# Patient Record
Sex: Male | Born: 1935
Health system: Southern US, Community
[De-identification: ages and names within clinical notes are randomized; demographics above are authoritative.]

---

## 2004-02-04 ENCOUNTER — Encounter: Admission: RE | Admit: 2004-02-04 | Discharge: 2004-02-28 | Payer: Self-pay | Admitting: Family Medicine

## 2005-07-08 ENCOUNTER — Encounter: Admission: RE | Admit: 2005-07-08 | Discharge: 2005-07-08 | Payer: Self-pay | Admitting: Family Medicine

## 2006-11-12 DIAGNOSIS — R55 Syncope and collapse: Secondary | ICD-10-CM

## 2006-11-12 DIAGNOSIS — I472 Ventricular tachycardia: Secondary | ICD-10-CM

## 2010-07-03 ENCOUNTER — Other Ambulatory Visit: Payer: Self-pay | Admitting: Gastroenterology

## 2010-07-04 ENCOUNTER — Ambulatory Visit
Admission: RE | Admit: 2010-07-04 | Discharge: 2010-07-04 | Disposition: A | Discharge status: Home / Self Care | Payer: Medicare HMO | Source: Ambulatory Visit | Attending: Gastroenterology | Admitting: Gastroenterology

## 2011-08-25 DIAGNOSIS — Z95810 Presence of automatic (implantable) cardiac defibrillator: Secondary | ICD-10-CM | POA: Insufficient documentation

## 2012-05-12 ENCOUNTER — Other Ambulatory Visit: Payer: Self-pay | Admitting: Family Medicine

## 2012-05-12 DIAGNOSIS — I679 Cerebrovascular disease, unspecified: Secondary | ICD-10-CM

## 2012-05-16 ENCOUNTER — Ambulatory Visit
Admission: RE | Admit: 2012-05-16 | Discharge: 2012-05-16 | Disposition: A | Discharge status: Home / Self Care | Payer: Medicare HMO | Source: Ambulatory Visit | Attending: Family Medicine | Admitting: Family Medicine

## 2012-05-16 DIAGNOSIS — I679 Cerebrovascular disease, unspecified: Secondary | ICD-10-CM

## 2014-01-12 DIAGNOSIS — R05 Cough: Secondary | ICD-10-CM | POA: Insufficient documentation

## 2014-01-12 DIAGNOSIS — R630 Anorexia: Secondary | ICD-10-CM | POA: Insufficient documentation

## 2014-01-12 DIAGNOSIS — R112 Nausea with vomiting, unspecified: Secondary | ICD-10-CM | POA: Insufficient documentation

## 2014-01-12 DIAGNOSIS — R058 Other specified cough: Secondary | ICD-10-CM | POA: Insufficient documentation

## 2014-01-13 DIAGNOSIS — E871 Hypo-osmolality and hyponatremia: Secondary | ICD-10-CM | POA: Insufficient documentation

## 2014-01-13 DIAGNOSIS — I272 Pulmonary hypertension, unspecified: Secondary | ICD-10-CM | POA: Insufficient documentation

## 2014-01-13 DIAGNOSIS — R942 Abnormal results of pulmonary function studies: Secondary | ICD-10-CM | POA: Insufficient documentation

## 2014-01-13 DIAGNOSIS — J984 Other disorders of lung: Secondary | ICD-10-CM | POA: Insufficient documentation

## 2014-01-13 DIAGNOSIS — I5022 Chronic systolic (congestive) heart failure: Secondary | ICD-10-CM | POA: Insufficient documentation

## 2014-03-24 DIAGNOSIS — R0602 Shortness of breath: Secondary | ICD-10-CM | POA: Diagnosis not present

## 2014-03-24 DIAGNOSIS — R110 Nausea: Secondary | ICD-10-CM | POA: Diagnosis not present

## 2014-03-24 DIAGNOSIS — J9811 Atelectasis: Secondary | ICD-10-CM | POA: Diagnosis not present

## 2014-03-24 DIAGNOSIS — R079 Chest pain, unspecified: Secondary | ICD-10-CM | POA: Diagnosis not present

## 2014-03-24 DIAGNOSIS — Z95810 Presence of automatic (implantable) cardiac defibrillator: Secondary | ICD-10-CM | POA: Diagnosis not present

## 2014-03-24 DIAGNOSIS — Z4502 Encounter for adjustment and management of automatic implantable cardiac defibrillator: Secondary | ICD-10-CM | POA: Diagnosis not present

## 2014-03-24 DIAGNOSIS — I517 Cardiomegaly: Secondary | ICD-10-CM | POA: Diagnosis not present

## 2014-03-29 DIAGNOSIS — R911 Solitary pulmonary nodule: Secondary | ICD-10-CM | POA: Diagnosis not present

## 2014-03-29 DIAGNOSIS — J209 Acute bronchitis, unspecified: Secondary | ICD-10-CM | POA: Diagnosis not present

## 2014-03-29 DIAGNOSIS — I10 Essential (primary) hypertension: Secondary | ICD-10-CM | POA: Diagnosis not present

## 2014-03-29 DIAGNOSIS — N390 Urinary tract infection, site not specified: Secondary | ICD-10-CM | POA: Diagnosis not present

## 2014-03-29 DIAGNOSIS — I517 Cardiomegaly: Secondary | ICD-10-CM | POA: Diagnosis not present

## 2014-03-29 DIAGNOSIS — I509 Heart failure, unspecified: Secondary | ICD-10-CM | POA: Diagnosis not present

## 2014-03-29 DIAGNOSIS — E041 Nontoxic single thyroid nodule: Secondary | ICD-10-CM | POA: Diagnosis not present

## 2014-03-29 DIAGNOSIS — R079 Chest pain, unspecified: Secondary | ICD-10-CM | POA: Diagnosis not present

## 2014-03-29 DIAGNOSIS — I712 Thoracic aortic aneurysm, without rupture: Secondary | ICD-10-CM | POA: Diagnosis not present

## 2014-03-29 DIAGNOSIS — I711 Thoracic aortic aneurysm, ruptured: Secondary | ICD-10-CM | POA: Diagnosis not present

## 2014-03-29 DIAGNOSIS — I252 Old myocardial infarction: Secondary | ICD-10-CM | POA: Diagnosis not present

## 2014-03-30 DIAGNOSIS — I4892 Unspecified atrial flutter: Secondary | ICD-10-CM | POA: Diagnosis not present

## 2014-03-30 DIAGNOSIS — I472 Ventricular tachycardia: Secondary | ICD-10-CM | POA: Diagnosis not present

## 2014-03-30 DIAGNOSIS — I484 Atypical atrial flutter: Secondary | ICD-10-CM | POA: Insufficient documentation

## 2014-03-30 DIAGNOSIS — I2510 Atherosclerotic heart disease of native coronary artery without angina pectoris: Secondary | ICD-10-CM | POA: Diagnosis not present

## 2014-03-30 DIAGNOSIS — I2583 Coronary atherosclerosis due to lipid rich plaque: Secondary | ICD-10-CM | POA: Diagnosis not present

## 2014-04-05 DIAGNOSIS — I255 Ischemic cardiomyopathy: Secondary | ICD-10-CM | POA: Diagnosis not present

## 2014-04-05 DIAGNOSIS — I481 Persistent atrial fibrillation: Secondary | ICD-10-CM | POA: Diagnosis not present

## 2014-04-05 DIAGNOSIS — I5022 Chronic systolic (congestive) heart failure: Secondary | ICD-10-CM | POA: Diagnosis not present

## 2014-04-09 DIAGNOSIS — I5022 Chronic systolic (congestive) heart failure: Secondary | ICD-10-CM | POA: Diagnosis not present

## 2014-04-09 DIAGNOSIS — I255 Ischemic cardiomyopathy: Secondary | ICD-10-CM | POA: Diagnosis not present

## 2014-04-09 DIAGNOSIS — E785 Hyperlipidemia, unspecified: Secondary | ICD-10-CM | POA: Diagnosis not present

## 2014-04-09 DIAGNOSIS — K219 Gastro-esophageal reflux disease without esophagitis: Secondary | ICD-10-CM | POA: Diagnosis not present

## 2014-04-09 DIAGNOSIS — I481 Persistent atrial fibrillation: Secondary | ICD-10-CM | POA: Diagnosis not present

## 2014-04-09 DIAGNOSIS — I2510 Atherosclerotic heart disease of native coronary artery without angina pectoris: Secondary | ICD-10-CM | POA: Diagnosis not present

## 2014-04-09 DIAGNOSIS — I480 Paroxysmal atrial fibrillation: Secondary | ICD-10-CM | POA: Diagnosis not present

## 2014-04-09 DIAGNOSIS — H409 Unspecified glaucoma: Secondary | ICD-10-CM | POA: Diagnosis not present

## 2014-04-10 DIAGNOSIS — I481 Persistent atrial fibrillation: Secondary | ICD-10-CM | POA: Diagnosis not present

## 2014-04-10 DIAGNOSIS — I4819 Other persistent atrial fibrillation: Secondary | ICD-10-CM | POA: Insufficient documentation

## 2014-04-11 DIAGNOSIS — I481 Persistent atrial fibrillation: Secondary | ICD-10-CM | POA: Diagnosis not present

## 2014-04-27 DIAGNOSIS — I481 Persistent atrial fibrillation: Secondary | ICD-10-CM | POA: Diagnosis not present

## 2014-04-27 DIAGNOSIS — I472 Ventricular tachycardia: Secondary | ICD-10-CM | POA: Diagnosis not present

## 2014-04-27 DIAGNOSIS — I255 Ischemic cardiomyopathy: Secondary | ICD-10-CM | POA: Diagnosis not present

## 2014-05-09 DIAGNOSIS — I5022 Chronic systolic (congestive) heart failure: Secondary | ICD-10-CM | POA: Diagnosis not present

## 2014-05-09 DIAGNOSIS — I255 Ischemic cardiomyopathy: Secondary | ICD-10-CM | POA: Diagnosis not present

## 2014-05-09 DIAGNOSIS — Z95810 Presence of automatic (implantable) cardiac defibrillator: Secondary | ICD-10-CM | POA: Diagnosis not present

## 2014-05-09 DIAGNOSIS — I472 Ventricular tachycardia: Secondary | ICD-10-CM | POA: Diagnosis not present

## 2014-05-10 IMAGING — US US CAROTID DUPLEX BILAT
1 series · 13 of 24 positions shown · non-contrast
Comparison: None available

CLINICAL DATA: Hypertension, coronary artery disease, old stroke.

BILATERAL CAROTID DUPLEX ULTRASOUND
TECHNIQUE: Gray scale imaging, color Doppler and duplex ultrasound
was performed of bilateral carotid and vertebral arteries in the
neck.

[Series 1: us carotid duplex bilat · 0.08mm/px · 13 of 62 slices shown]
[im 1/62]
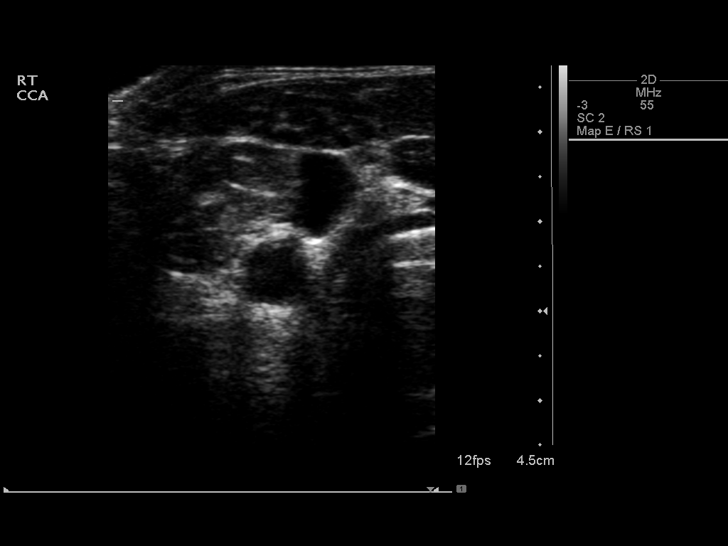
[im 6/62]
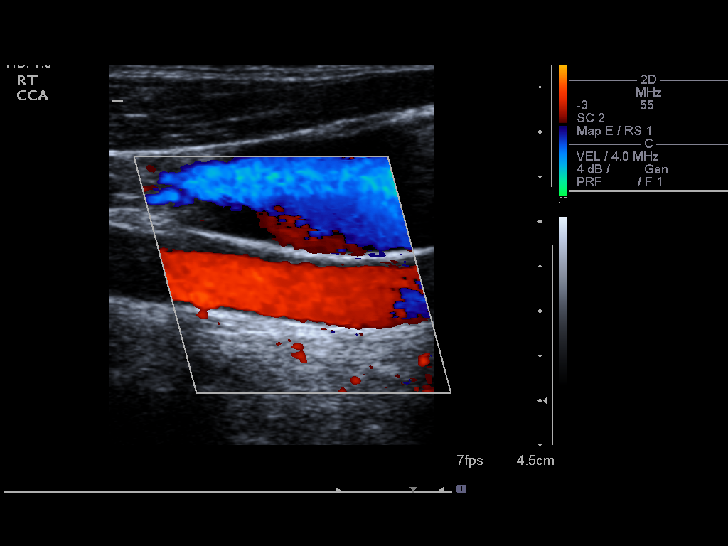
[im 11/62]
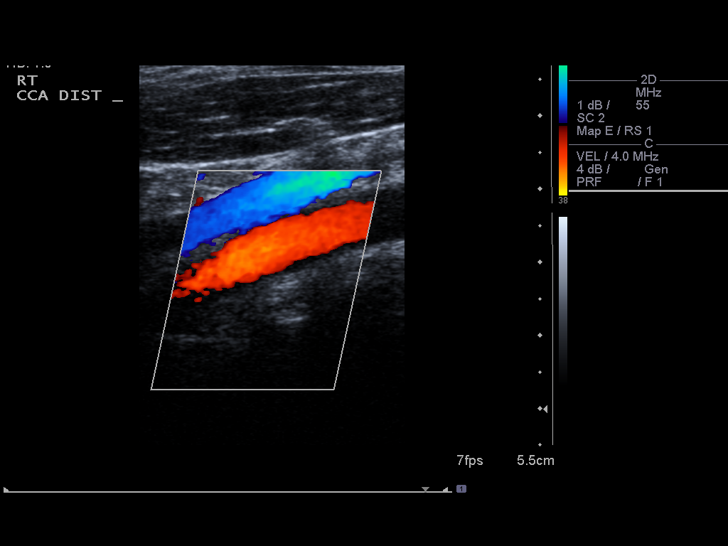
[im 16/62]
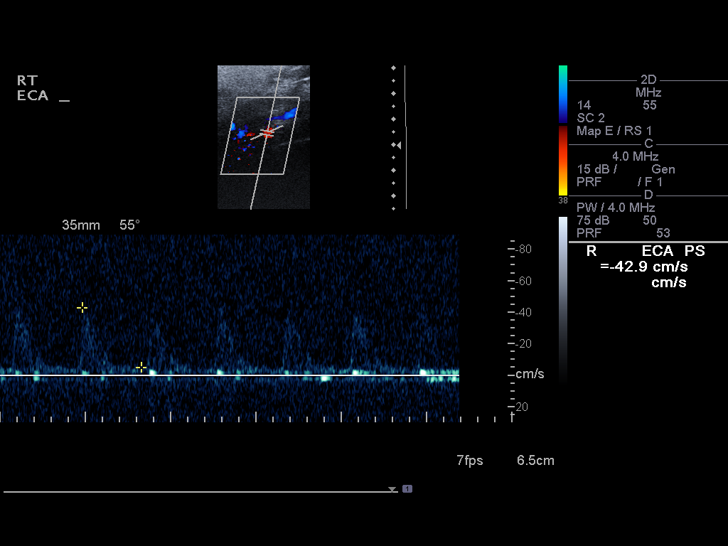
[im 22/62]
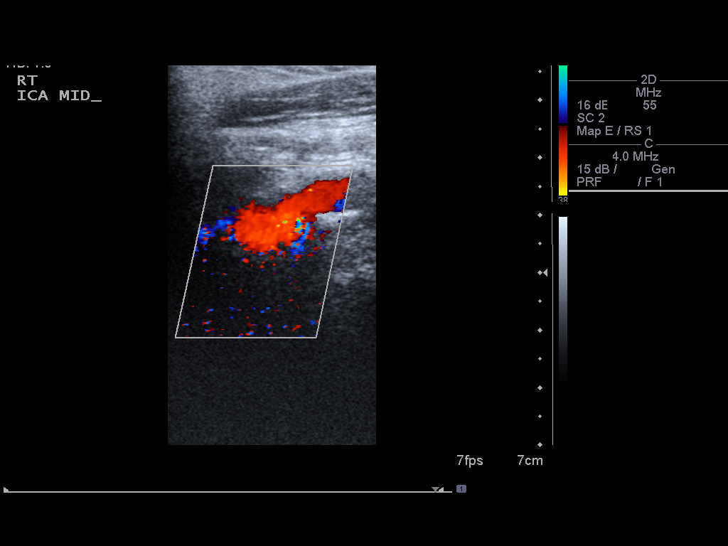
[im 27/62]
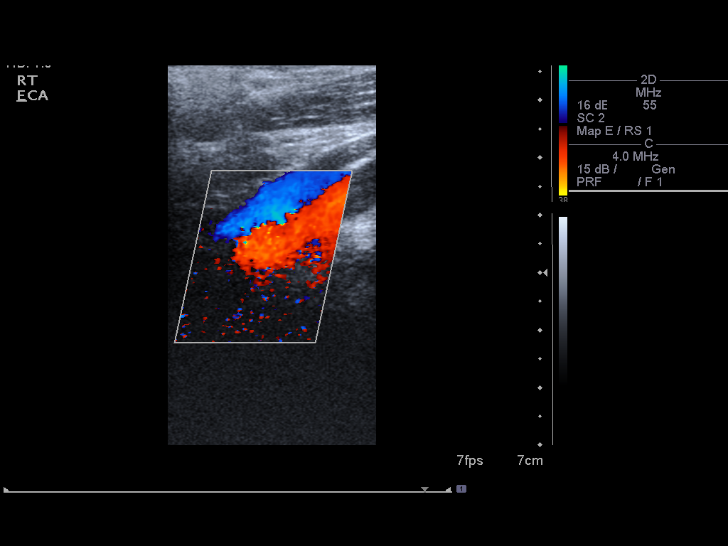
[im 32/62]
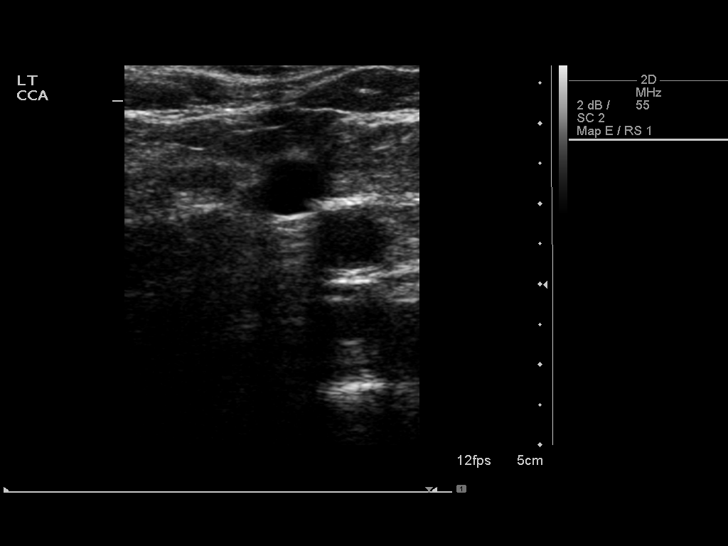
[im 35/62]
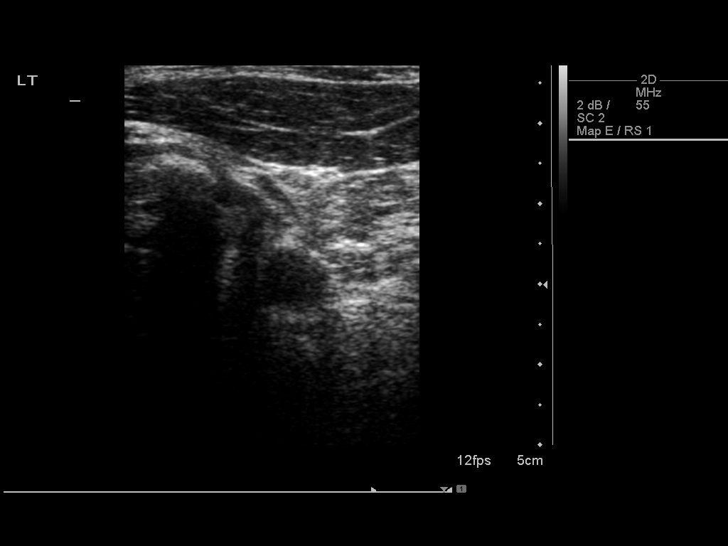
[im 40/62]
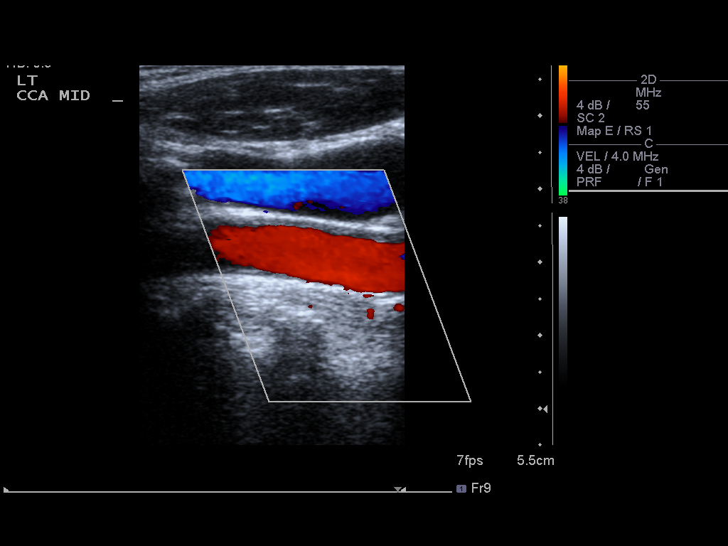
[im 46/62]
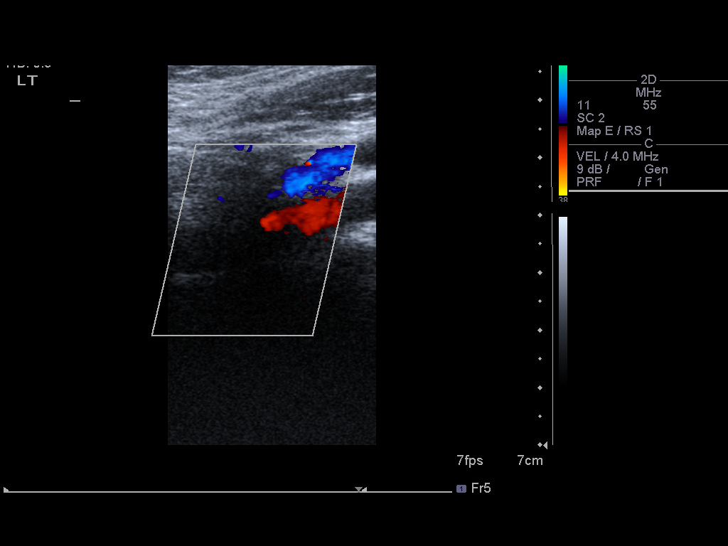
[im 51/62]
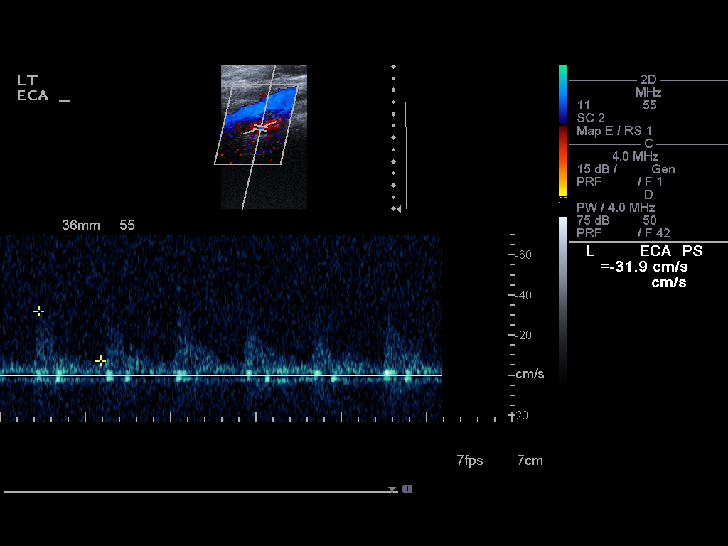
[im 56/62]
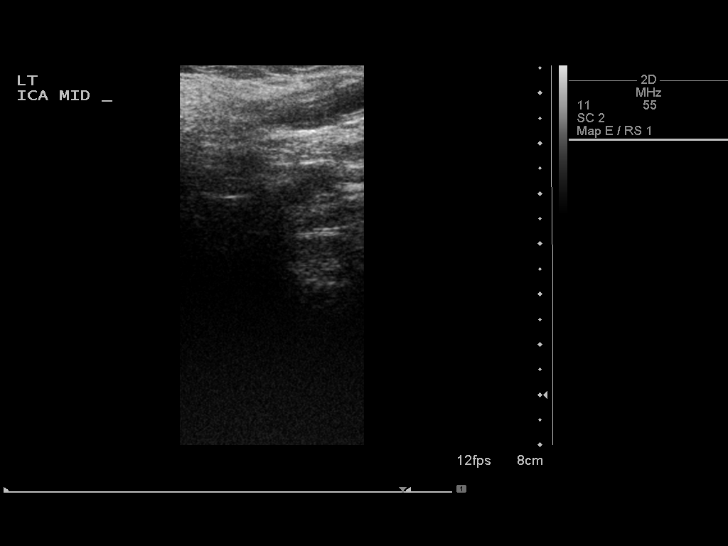
[im 62/62]
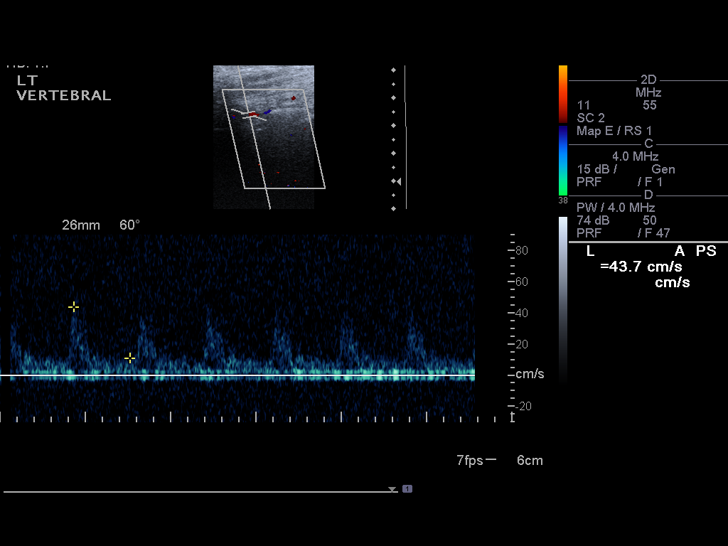

[13 of 24 positions shown; findings below may reference images not displayed]

Criteria:  Quantification of carotid stenosis is based on velocity
parameters that correlate the residual internal carotid diameter
with NASCET-based stenosis levels, using the diameter of the distal
internal carotid lumen as the denominator for stenosis measurement.

The following velocity measurements were obtained:

                 PEAK SYSTOLIC/END DIASTOLIC
RIGHT
ICA:                        60/18cm/sec
CCA:                        80/13cm/sec
SYSTOLIC ICA/CCA RATIO:
DIASTOLIC ICA/CCA RATIO:
ECA:                        49cm/sec

LEFT
ICA:                        49/15cm/sec
CCA:                        72/15cm/sec
SYSTOLIC ICA/CCA RATIO:
DIASTOLIC ICA/CCA RATIO:
ECA:                        32cm/sec
FINDINGS: RIGHT CAROTID ARTERY: There is diffuse intimal thickening through
the common carotid artery and bulb without focal plaque
accumulation or stenosis.  Normal wave forms and color Doppler
signal.

RIGHT VERTEBRAL ARTERY:  Normal flow direction and waveform.

LEFT CAROTID ARTERY: There is intimal thickening through the common
carotid artery.  No focal plaque accumulation or stenosis.  Normal
wave forms and color Doppler signal.

LEFT VERTEBRAL ARTERY:  Normal flow direction and waveform.
IMPRESSION: 1.  No significant carotid bifurcation plaque or stenosis.

## 2014-06-05 DIAGNOSIS — R079 Chest pain, unspecified: Secondary | ICD-10-CM | POA: Diagnosis not present

## 2014-06-05 DIAGNOSIS — R6884 Jaw pain: Secondary | ICD-10-CM | POA: Diagnosis not present

## 2014-06-05 DIAGNOSIS — I5022 Chronic systolic (congestive) heart failure: Secondary | ICD-10-CM | POA: Diagnosis not present

## 2014-06-05 DIAGNOSIS — I428 Other cardiomyopathies: Secondary | ICD-10-CM | POA: Diagnosis not present

## 2014-06-05 DIAGNOSIS — I272 Other secondary pulmonary hypertension: Secondary | ICD-10-CM | POA: Diagnosis not present

## 2014-06-05 DIAGNOSIS — I429 Cardiomyopathy, unspecified: Secondary | ICD-10-CM | POA: Diagnosis not present

## 2014-06-05 DIAGNOSIS — I4891 Unspecified atrial fibrillation: Secondary | ICD-10-CM | POA: Diagnosis not present

## 2014-06-05 DIAGNOSIS — R0602 Shortness of breath: Secondary | ICD-10-CM | POA: Diagnosis not present

## 2014-06-05 DIAGNOSIS — R0789 Other chest pain: Secondary | ICD-10-CM | POA: Diagnosis not present

## 2014-06-05 DIAGNOSIS — I270 Primary pulmonary hypertension: Secondary | ICD-10-CM | POA: Diagnosis not present

## 2014-06-05 DIAGNOSIS — E871 Hypo-osmolality and hyponatremia: Secondary | ICD-10-CM | POA: Diagnosis not present

## 2014-06-05 DIAGNOSIS — I340 Nonrheumatic mitral (valve) insufficiency: Secondary | ICD-10-CM | POA: Diagnosis not present

## 2014-06-05 DIAGNOSIS — M542 Cervicalgia: Secondary | ICD-10-CM | POA: Diagnosis not present

## 2014-06-05 DIAGNOSIS — R200 Anesthesia of skin: Secondary | ICD-10-CM | POA: Diagnosis not present

## 2014-06-05 DIAGNOSIS — I509 Heart failure, unspecified: Secondary | ICD-10-CM | POA: Diagnosis not present

## 2014-06-06 DIAGNOSIS — E871 Hypo-osmolality and hyponatremia: Secondary | ICD-10-CM | POA: Diagnosis not present

## 2014-06-06 DIAGNOSIS — I5022 Chronic systolic (congestive) heart failure: Secondary | ICD-10-CM | POA: Diagnosis not present

## 2014-06-06 DIAGNOSIS — R079 Chest pain, unspecified: Secondary | ICD-10-CM | POA: Diagnosis not present

## 2014-06-06 DIAGNOSIS — I509 Heart failure, unspecified: Secondary | ICD-10-CM | POA: Diagnosis not present

## 2014-06-06 DIAGNOSIS — I270 Primary pulmonary hypertension: Secondary | ICD-10-CM | POA: Diagnosis not present

## 2014-06-06 DIAGNOSIS — I429 Cardiomyopathy, unspecified: Secondary | ICD-10-CM | POA: Diagnosis not present

## 2014-06-06 DIAGNOSIS — I4891 Unspecified atrial fibrillation: Secondary | ICD-10-CM | POA: Diagnosis not present

## 2014-06-06 DIAGNOSIS — I272 Other secondary pulmonary hypertension: Secondary | ICD-10-CM | POA: Diagnosis not present

## 2014-06-06 DIAGNOSIS — R0602 Shortness of breath: Secondary | ICD-10-CM | POA: Diagnosis not present

## 2014-06-06 DIAGNOSIS — R6884 Jaw pain: Secondary | ICD-10-CM | POA: Diagnosis not present

## 2014-06-06 DIAGNOSIS — I428 Other cardiomyopathies: Secondary | ICD-10-CM | POA: Diagnosis not present

## 2014-06-06 DIAGNOSIS — I340 Nonrheumatic mitral (valve) insufficiency: Secondary | ICD-10-CM | POA: Diagnosis not present

## 2014-06-13 DIAGNOSIS — I509 Heart failure, unspecified: Secondary | ICD-10-CM | POA: Diagnosis not present

## 2014-06-26 DIAGNOSIS — R208 Other disturbances of skin sensation: Secondary | ICD-10-CM | POA: Diagnosis not present

## 2014-06-26 DIAGNOSIS — M79604 Pain in right leg: Secondary | ICD-10-CM | POA: Diagnosis not present

## 2014-06-26 DIAGNOSIS — M79642 Pain in left hand: Secondary | ICD-10-CM | POA: Diagnosis not present

## 2014-06-26 DIAGNOSIS — M79605 Pain in left leg: Secondary | ICD-10-CM | POA: Diagnosis not present

## 2014-06-27 DIAGNOSIS — I2510 Atherosclerotic heart disease of native coronary artery without angina pectoris: Secondary | ICD-10-CM | POA: Diagnosis not present

## 2014-06-27 DIAGNOSIS — R197 Diarrhea, unspecified: Secondary | ICD-10-CM | POA: Diagnosis not present

## 2014-06-27 DIAGNOSIS — R079 Chest pain, unspecified: Secondary | ICD-10-CM | POA: Diagnosis not present

## 2014-06-27 DIAGNOSIS — I509 Heart failure, unspecified: Secondary | ICD-10-CM | POA: Diagnosis not present

## 2014-06-27 DIAGNOSIS — R109 Unspecified abdominal pain: Secondary | ICD-10-CM | POA: Diagnosis not present

## 2014-06-27 DIAGNOSIS — E780 Pure hypercholesterolemia: Secondary | ICD-10-CM | POA: Diagnosis not present

## 2014-06-27 DIAGNOSIS — R112 Nausea with vomiting, unspecified: Secondary | ICD-10-CM | POA: Diagnosis not present

## 2014-06-27 DIAGNOSIS — I252 Old myocardial infarction: Secondary | ICD-10-CM | POA: Diagnosis not present

## 2014-06-27 DIAGNOSIS — I5022 Chronic systolic (congestive) heart failure: Secondary | ICD-10-CM | POA: Diagnosis not present

## 2014-06-27 DIAGNOSIS — I270 Primary pulmonary hypertension: Secondary | ICD-10-CM | POA: Diagnosis not present

## 2014-06-27 DIAGNOSIS — I4891 Unspecified atrial fibrillation: Secondary | ICD-10-CM | POA: Diagnosis not present

## 2014-06-27 DIAGNOSIS — R0602 Shortness of breath: Secondary | ICD-10-CM | POA: Diagnosis not present

## 2014-06-27 DIAGNOSIS — I10 Essential (primary) hypertension: Secondary | ICD-10-CM | POA: Diagnosis not present

## 2014-06-27 DIAGNOSIS — R0789 Other chest pain: Secondary | ICD-10-CM | POA: Diagnosis not present

## 2014-06-27 DIAGNOSIS — K219 Gastro-esophageal reflux disease without esophagitis: Secondary | ICD-10-CM | POA: Diagnosis not present

## 2014-06-28 DIAGNOSIS — E780 Pure hypercholesterolemia: Secondary | ICD-10-CM | POA: Diagnosis not present

## 2014-06-28 DIAGNOSIS — R0789 Other chest pain: Secondary | ICD-10-CM | POA: Diagnosis not present

## 2014-06-28 DIAGNOSIS — R079 Chest pain, unspecified: Secondary | ICD-10-CM | POA: Diagnosis not present

## 2014-06-28 DIAGNOSIS — Z950 Presence of cardiac pacemaker: Secondary | ICD-10-CM | POA: Diagnosis not present

## 2014-06-28 DIAGNOSIS — I4891 Unspecified atrial fibrillation: Secondary | ICD-10-CM | POA: Diagnosis not present

## 2014-06-28 DIAGNOSIS — I2510 Atherosclerotic heart disease of native coronary artery without angina pectoris: Secondary | ICD-10-CM | POA: Diagnosis not present

## 2014-06-28 DIAGNOSIS — I429 Cardiomyopathy, unspecified: Secondary | ICD-10-CM | POA: Diagnosis not present

## 2014-06-28 DIAGNOSIS — I270 Primary pulmonary hypertension: Secondary | ICD-10-CM | POA: Diagnosis not present

## 2014-06-28 DIAGNOSIS — K219 Gastro-esophageal reflux disease without esophagitis: Secondary | ICD-10-CM | POA: Diagnosis not present

## 2014-06-28 DIAGNOSIS — R197 Diarrhea, unspecified: Secondary | ICD-10-CM | POA: Diagnosis not present

## 2014-06-28 DIAGNOSIS — I10 Essential (primary) hypertension: Secondary | ICD-10-CM | POA: Diagnosis not present

## 2014-06-28 DIAGNOSIS — I252 Old myocardial infarction: Secondary | ICD-10-CM | POA: Diagnosis not present

## 2014-06-28 DIAGNOSIS — I5022 Chronic systolic (congestive) heart failure: Secondary | ICD-10-CM | POA: Diagnosis not present

## 2014-06-28 DIAGNOSIS — I509 Heart failure, unspecified: Secondary | ICD-10-CM | POA: Diagnosis not present

## 2014-07-06 DIAGNOSIS — I4891 Unspecified atrial fibrillation: Secondary | ICD-10-CM | POA: Diagnosis not present

## 2014-07-06 DIAGNOSIS — I509 Heart failure, unspecified: Secondary | ICD-10-CM | POA: Diagnosis not present

## 2014-07-24 DIAGNOSIS — I5022 Chronic systolic (congestive) heart failure: Secondary | ICD-10-CM | POA: Diagnosis not present

## 2014-07-24 DIAGNOSIS — I472 Ventricular tachycardia: Secondary | ICD-10-CM | POA: Diagnosis not present

## 2014-07-24 DIAGNOSIS — I481 Persistent atrial fibrillation: Secondary | ICD-10-CM | POA: Diagnosis not present

## 2014-07-24 DIAGNOSIS — Z4502 Encounter for adjustment and management of automatic implantable cardiac defibrillator: Secondary | ICD-10-CM | POA: Diagnosis not present

## 2014-07-24 DIAGNOSIS — I255 Ischemic cardiomyopathy: Secondary | ICD-10-CM | POA: Diagnosis not present

## 2014-07-24 DIAGNOSIS — I4892 Unspecified atrial flutter: Secondary | ICD-10-CM | POA: Diagnosis not present

## 2014-08-17 DIAGNOSIS — G5602 Carpal tunnel syndrome, left upper limb: Secondary | ICD-10-CM | POA: Diagnosis not present

## 2014-09-12 DIAGNOSIS — M79642 Pain in left hand: Secondary | ICD-10-CM | POA: Diagnosis not present

## 2014-09-12 DIAGNOSIS — G5602 Carpal tunnel syndrome, left upper limb: Secondary | ICD-10-CM | POA: Diagnosis not present

## 2014-09-21 DIAGNOSIS — G5602 Carpal tunnel syndrome, left upper limb: Secondary | ICD-10-CM | POA: Diagnosis not present

## 2014-09-21 DIAGNOSIS — G5622 Lesion of ulnar nerve, left upper limb: Secondary | ICD-10-CM | POA: Insufficient documentation

## 2014-09-26 DIAGNOSIS — G5622 Lesion of ulnar nerve, left upper limb: Secondary | ICD-10-CM | POA: Diagnosis not present

## 2014-09-26 DIAGNOSIS — G5602 Carpal tunnel syndrome, left upper limb: Secondary | ICD-10-CM | POA: Diagnosis not present

## 2014-11-02 DIAGNOSIS — I255 Ischemic cardiomyopathy: Secondary | ICD-10-CM | POA: Diagnosis not present

## 2014-11-02 DIAGNOSIS — I481 Persistent atrial fibrillation: Secondary | ICD-10-CM | POA: Diagnosis not present

## 2014-11-02 DIAGNOSIS — I472 Ventricular tachycardia: Secondary | ICD-10-CM | POA: Diagnosis not present

## 2014-11-02 DIAGNOSIS — Z95810 Presence of automatic (implantable) cardiac defibrillator: Secondary | ICD-10-CM | POA: Diagnosis not present

## 2014-12-04 DIAGNOSIS — G5602 Carpal tunnel syndrome, left upper limb: Secondary | ICD-10-CM | POA: Diagnosis not present

## 2014-12-10 DIAGNOSIS — M25532 Pain in left wrist: Secondary | ICD-10-CM | POA: Diagnosis not present

## 2014-12-10 DIAGNOSIS — G5602 Carpal tunnel syndrome, left upper limb: Secondary | ICD-10-CM | POA: Diagnosis not present

## 2015-01-08 DIAGNOSIS — G5602 Carpal tunnel syndrome, left upper limb: Secondary | ICD-10-CM | POA: Diagnosis not present

## 2015-01-31 DIAGNOSIS — I255 Ischemic cardiomyopathy: Secondary | ICD-10-CM | POA: Diagnosis not present

## 2015-01-31 DIAGNOSIS — I4892 Unspecified atrial flutter: Secondary | ICD-10-CM | POA: Diagnosis not present

## 2015-01-31 DIAGNOSIS — I481 Persistent atrial fibrillation: Secondary | ICD-10-CM | POA: Diagnosis not present

## 2015-01-31 DIAGNOSIS — I5022 Chronic systolic (congestive) heart failure: Secondary | ICD-10-CM | POA: Diagnosis not present

## 2015-01-31 DIAGNOSIS — I472 Ventricular tachycardia: Secondary | ICD-10-CM | POA: Diagnosis not present

## 2015-01-31 DIAGNOSIS — Z4502 Encounter for adjustment and management of automatic implantable cardiac defibrillator: Secondary | ICD-10-CM | POA: Diagnosis not present

## 2015-02-12 DIAGNOSIS — G5602 Carpal tunnel syndrome, left upper limb: Secondary | ICD-10-CM | POA: Diagnosis not present

## 2015-04-22 DIAGNOSIS — R05 Cough: Secondary | ICD-10-CM | POA: Diagnosis not present

## 2015-04-22 DIAGNOSIS — R635 Abnormal weight gain: Secondary | ICD-10-CM | POA: Diagnosis not present

## 2015-04-22 DIAGNOSIS — I509 Heart failure, unspecified: Secondary | ICD-10-CM | POA: Diagnosis not present

## 2015-04-22 DIAGNOSIS — I340 Nonrheumatic mitral (valve) insufficiency: Secondary | ICD-10-CM | POA: Diagnosis not present

## 2015-04-22 DIAGNOSIS — Z95810 Presence of automatic (implantable) cardiac defibrillator: Secondary | ICD-10-CM | POA: Diagnosis not present

## 2015-04-22 DIAGNOSIS — I4891 Unspecified atrial fibrillation: Secondary | ICD-10-CM | POA: Diagnosis not present

## 2015-04-22 DIAGNOSIS — R0989 Other specified symptoms and signs involving the circulatory and respiratory systems: Secondary | ICD-10-CM | POA: Diagnosis not present

## 2015-04-22 DIAGNOSIS — I270 Primary pulmonary hypertension: Secondary | ICD-10-CM | POA: Diagnosis not present

## 2015-04-26 DIAGNOSIS — R0989 Other specified symptoms and signs involving the circulatory and respiratory systems: Secondary | ICD-10-CM | POA: Diagnosis not present

## 2015-04-26 DIAGNOSIS — I509 Heart failure, unspecified: Secondary | ICD-10-CM | POA: Diagnosis not present

## 2015-04-26 DIAGNOSIS — R05 Cough: Secondary | ICD-10-CM | POA: Diagnosis not present

## 2015-04-26 DIAGNOSIS — I340 Nonrheumatic mitral (valve) insufficiency: Secondary | ICD-10-CM | POA: Diagnosis not present

## 2015-04-26 DIAGNOSIS — I270 Primary pulmonary hypertension: Secondary | ICD-10-CM | POA: Diagnosis not present

## 2015-04-26 DIAGNOSIS — I4891 Unspecified atrial fibrillation: Secondary | ICD-10-CM | POA: Diagnosis not present

## 2015-05-08 DIAGNOSIS — I255 Ischemic cardiomyopathy: Secondary | ICD-10-CM | POA: Diagnosis not present

## 2015-05-08 DIAGNOSIS — Z95810 Presence of automatic (implantable) cardiac defibrillator: Secondary | ICD-10-CM | POA: Diagnosis not present

## 2015-05-08 DIAGNOSIS — I472 Ventricular tachycardia: Secondary | ICD-10-CM | POA: Diagnosis not present

## 2015-05-15 DIAGNOSIS — I10 Essential (primary) hypertension: Secondary | ICD-10-CM | POA: Diagnosis not present

## 2015-05-15 DIAGNOSIS — I509 Heart failure, unspecified: Secondary | ICD-10-CM | POA: Diagnosis not present

## 2015-05-15 DIAGNOSIS — I4891 Unspecified atrial fibrillation: Secondary | ICD-10-CM | POA: Diagnosis not present

## 2015-06-21 DIAGNOSIS — R8290 Unspecified abnormal findings in urine: Secondary | ICD-10-CM | POA: Diagnosis not present

## 2015-06-21 DIAGNOSIS — M545 Low back pain: Secondary | ICD-10-CM | POA: Diagnosis not present

## 2015-08-19 DIAGNOSIS — M25551 Pain in right hip: Secondary | ICD-10-CM | POA: Diagnosis not present

## 2015-08-19 DIAGNOSIS — N390 Urinary tract infection, site not specified: Secondary | ICD-10-CM | POA: Diagnosis not present

## 2015-08-19 DIAGNOSIS — M545 Low back pain: Secondary | ICD-10-CM | POA: Diagnosis not present

## 2015-08-19 DIAGNOSIS — Z7901 Long term (current) use of anticoagulants: Secondary | ICD-10-CM | POA: Diagnosis not present

## 2015-08-21 ENCOUNTER — Other Ambulatory Visit: Payer: Self-pay | Admitting: Family Medicine

## 2015-08-21 DIAGNOSIS — M1611 Unilateral primary osteoarthritis, right hip: Secondary | ICD-10-CM | POA: Diagnosis not present

## 2015-08-21 DIAGNOSIS — M8588 Other specified disorders of bone density and structure, other site: Secondary | ICD-10-CM | POA: Diagnosis not present

## 2015-08-21 DIAGNOSIS — I10 Essential (primary) hypertension: Secondary | ICD-10-CM | POA: Diagnosis not present

## 2015-08-21 DIAGNOSIS — N3090 Cystitis, unspecified without hematuria: Secondary | ICD-10-CM | POA: Diagnosis not present

## 2015-08-21 DIAGNOSIS — R109 Unspecified abdominal pain: Secondary | ICD-10-CM

## 2015-08-21 DIAGNOSIS — I714 Abdominal aortic aneurysm, without rupture: Secondary | ICD-10-CM | POA: Diagnosis not present

## 2015-08-21 DIAGNOSIS — E785 Hyperlipidemia, unspecified: Secondary | ICD-10-CM | POA: Diagnosis not present

## 2015-08-21 DIAGNOSIS — R1032 Left lower quadrant pain: Secondary | ICD-10-CM | POA: Diagnosis not present

## 2015-08-21 DIAGNOSIS — N281 Cyst of kidney, acquired: Secondary | ICD-10-CM | POA: Diagnosis not present

## 2015-08-21 DIAGNOSIS — M47816 Spondylosis without myelopathy or radiculopathy, lumbar region: Secondary | ICD-10-CM | POA: Diagnosis not present

## 2015-08-23 DIAGNOSIS — I714 Abdominal aortic aneurysm, without rupture: Secondary | ICD-10-CM | POA: Diagnosis not present

## 2015-08-23 DIAGNOSIS — R109 Unspecified abdominal pain: Secondary | ICD-10-CM | POA: Diagnosis not present

## 2015-08-23 DIAGNOSIS — M25551 Pain in right hip: Secondary | ICD-10-CM | POA: Diagnosis not present

## 2015-08-26 DIAGNOSIS — M25551 Pain in right hip: Secondary | ICD-10-CM | POA: Diagnosis not present

## 2015-08-26 DIAGNOSIS — M545 Low back pain: Secondary | ICD-10-CM | POA: Diagnosis not present

## 2015-09-13 DIAGNOSIS — I5022 Chronic systolic (congestive) heart failure: Secondary | ICD-10-CM | POA: Diagnosis not present

## 2015-09-13 DIAGNOSIS — I472 Ventricular tachycardia: Secondary | ICD-10-CM | POA: Diagnosis not present

## 2015-09-13 DIAGNOSIS — I4892 Unspecified atrial flutter: Secondary | ICD-10-CM | POA: Diagnosis not present

## 2015-09-13 DIAGNOSIS — I255 Ischemic cardiomyopathy: Secondary | ICD-10-CM | POA: Diagnosis not present

## 2015-09-13 DIAGNOSIS — Z4502 Encounter for adjustment and management of automatic implantable cardiac defibrillator: Secondary | ICD-10-CM | POA: Diagnosis not present

## 2015-09-13 DIAGNOSIS — I481 Persistent atrial fibrillation: Secondary | ICD-10-CM | POA: Diagnosis not present

## 2015-10-16 DIAGNOSIS — R05 Cough: Secondary | ICD-10-CM | POA: Diagnosis not present

## 2015-11-01 DIAGNOSIS — R05 Cough: Secondary | ICD-10-CM | POA: Diagnosis not present

## 2015-12-23 DIAGNOSIS — I481 Persistent atrial fibrillation: Secondary | ICD-10-CM | POA: Diagnosis not present

## 2015-12-23 DIAGNOSIS — Z95810 Presence of automatic (implantable) cardiac defibrillator: Secondary | ICD-10-CM | POA: Diagnosis not present

## 2015-12-23 DIAGNOSIS — I5022 Chronic systolic (congestive) heart failure: Secondary | ICD-10-CM | POA: Diagnosis not present

## 2015-12-23 DIAGNOSIS — I255 Ischemic cardiomyopathy: Secondary | ICD-10-CM | POA: Diagnosis not present

## 2016-02-27 DIAGNOSIS — R05 Cough: Secondary | ICD-10-CM | POA: Diagnosis not present

## 2016-03-02 DIAGNOSIS — I5022 Chronic systolic (congestive) heart failure: Secondary | ICD-10-CM | POA: Diagnosis not present

## 2016-03-02 DIAGNOSIS — I255 Ischemic cardiomyopathy: Secondary | ICD-10-CM | POA: Diagnosis not present

## 2016-03-02 DIAGNOSIS — I480 Paroxysmal atrial fibrillation: Secondary | ICD-10-CM | POA: Diagnosis not present

## 2016-03-02 DIAGNOSIS — I484 Atypical atrial flutter: Secondary | ICD-10-CM | POA: Diagnosis not present

## 2016-03-03 DIAGNOSIS — I4891 Unspecified atrial fibrillation: Secondary | ICD-10-CM | POA: Diagnosis not present

## 2016-04-06 DIAGNOSIS — Z95810 Presence of automatic (implantable) cardiac defibrillator: Secondary | ICD-10-CM | POA: Diagnosis not present

## 2016-04-06 DIAGNOSIS — I255 Ischemic cardiomyopathy: Secondary | ICD-10-CM | POA: Diagnosis not present

## 2016-04-06 DIAGNOSIS — I5022 Chronic systolic (congestive) heart failure: Secondary | ICD-10-CM | POA: Diagnosis not present

## 2016-04-06 DIAGNOSIS — I472 Ventricular tachycardia: Secondary | ICD-10-CM | POA: Diagnosis not present

## 2016-06-24 DIAGNOSIS — I255 Ischemic cardiomyopathy: Secondary | ICD-10-CM | POA: Diagnosis not present

## 2016-08-19 ENCOUNTER — Other Ambulatory Visit: Payer: Self-pay | Admitting: Family Medicine

## 2016-08-19 DIAGNOSIS — I714 Abdominal aortic aneurysm, without rupture, unspecified: Secondary | ICD-10-CM

## 2016-08-27 ENCOUNTER — Ambulatory Visit
Admission: RE | Admit: 2016-08-27 | Discharge: 2016-08-27 | Disposition: A | Discharge status: Home / Self Care | Payer: Medicare HMO | Source: Ambulatory Visit | Attending: Family Medicine | Admitting: Family Medicine

## 2016-08-27 DIAGNOSIS — I714 Abdominal aortic aneurysm, without rupture, unspecified: Secondary | ICD-10-CM

## 2016-10-06 DIAGNOSIS — I484 Atypical atrial flutter: Secondary | ICD-10-CM | POA: Diagnosis not present

## 2016-10-06 DIAGNOSIS — I255 Ischemic cardiomyopathy: Secondary | ICD-10-CM | POA: Diagnosis not present

## 2016-10-06 DIAGNOSIS — I472 Ventricular tachycardia: Secondary | ICD-10-CM | POA: Diagnosis not present

## 2016-10-06 DIAGNOSIS — Z4502 Encounter for adjustment and management of automatic implantable cardiac defibrillator: Secondary | ICD-10-CM | POA: Diagnosis not present

## 2016-10-06 DIAGNOSIS — I2510 Atherosclerotic heart disease of native coronary artery without angina pectoris: Secondary | ICD-10-CM | POA: Diagnosis not present

## 2016-10-06 DIAGNOSIS — I481 Persistent atrial fibrillation: Secondary | ICD-10-CM | POA: Diagnosis not present

## 2016-11-20 ENCOUNTER — Other Ambulatory Visit: Payer: Self-pay | Admitting: Family Medicine

## 2016-11-20 DIAGNOSIS — I739 Peripheral vascular disease, unspecified: Secondary | ICD-10-CM

## 2016-11-20 DIAGNOSIS — E669 Obesity, unspecified: Secondary | ICD-10-CM | POA: Diagnosis not present

## 2016-11-20 DIAGNOSIS — Z23 Encounter for immunization: Secondary | ICD-10-CM | POA: Diagnosis not present

## 2016-11-20 DIAGNOSIS — I10 Essential (primary) hypertension: Secondary | ICD-10-CM | POA: Diagnosis not present

## 2016-11-20 DIAGNOSIS — Z6832 Body mass index (BMI) 32.0-32.9, adult: Secondary | ICD-10-CM | POA: Diagnosis not present

## 2016-11-26 ENCOUNTER — Ambulatory Visit
Admission: RE | Admit: 2016-11-26 | Discharge: 2016-11-26 | Disposition: A | Discharge status: Home / Self Care | Payer: Medicare HMO | Source: Ambulatory Visit | Attending: Family Medicine | Admitting: Family Medicine

## 2016-11-26 DIAGNOSIS — I739 Peripheral vascular disease, unspecified: Secondary | ICD-10-CM

## 2017-01-06 DIAGNOSIS — Z4502 Encounter for adjustment and management of automatic implantable cardiac defibrillator: Secondary | ICD-10-CM | POA: Diagnosis not present

## 2017-01-08 DIAGNOSIS — R0609 Other forms of dyspnea: Secondary | ICD-10-CM | POA: Diagnosis not present

## 2017-01-08 DIAGNOSIS — R5383 Other fatigue: Secondary | ICD-10-CM | POA: Diagnosis not present

## 2017-01-08 DIAGNOSIS — Z6832 Body mass index (BMI) 32.0-32.9, adult: Secondary | ICD-10-CM | POA: Diagnosis not present

## 2017-01-08 DIAGNOSIS — I255 Ischemic cardiomyopathy: Secondary | ICD-10-CM | POA: Diagnosis not present

## 2017-04-08 DIAGNOSIS — R0789 Other chest pain: Secondary | ICD-10-CM | POA: Diagnosis not present

## 2017-04-08 DIAGNOSIS — N3000 Acute cystitis without hematuria: Secondary | ICD-10-CM | POA: Diagnosis not present

## 2017-04-08 DIAGNOSIS — R0602 Shortness of breath: Secondary | ICD-10-CM | POA: Diagnosis not present

## 2017-04-08 DIAGNOSIS — R0989 Other specified symptoms and signs involving the circulatory and respiratory systems: Secondary | ICD-10-CM | POA: Diagnosis not present

## 2017-04-08 DIAGNOSIS — I2510 Atherosclerotic heart disease of native coronary artery without angina pectoris: Secondary | ICD-10-CM | POA: Diagnosis not present

## 2017-04-08 DIAGNOSIS — I4891 Unspecified atrial fibrillation: Secondary | ICD-10-CM | POA: Diagnosis not present

## 2017-04-08 DIAGNOSIS — Z95810 Presence of automatic (implantable) cardiac defibrillator: Secondary | ICD-10-CM | POA: Diagnosis not present

## 2017-04-08 DIAGNOSIS — N183 Chronic kidney disease, stage 3 unspecified: Secondary | ICD-10-CM | POA: Insufficient documentation

## 2017-04-08 DIAGNOSIS — R079 Chest pain, unspecified: Secondary | ICD-10-CM | POA: Diagnosis not present

## 2017-04-08 DIAGNOSIS — J9811 Atelectasis: Secondary | ICD-10-CM | POA: Diagnosis not present

## 2017-04-08 DIAGNOSIS — D72829 Elevated white blood cell count, unspecified: Secondary | ICD-10-CM | POA: Insufficient documentation

## 2017-04-08 DIAGNOSIS — I129 Hypertensive chronic kidney disease with stage 1 through stage 4 chronic kidney disease, or unspecified chronic kidney disease: Secondary | ICD-10-CM | POA: Diagnosis not present

## 2017-04-08 DIAGNOSIS — I255 Ischemic cardiomyopathy: Secondary | ICD-10-CM | POA: Diagnosis not present

## 2017-04-08 DIAGNOSIS — I481 Persistent atrial fibrillation: Secondary | ICD-10-CM | POA: Diagnosis not present

## 2017-04-08 DIAGNOSIS — R748 Abnormal levels of other serum enzymes: Secondary | ICD-10-CM | POA: Insufficient documentation

## 2017-04-08 DIAGNOSIS — R002 Palpitations: Secondary | ICD-10-CM | POA: Insufficient documentation

## 2017-04-09 DIAGNOSIS — I083 Combined rheumatic disorders of mitral, aortic and tricuspid valves: Secondary | ICD-10-CM | POA: Diagnosis not present

## 2017-04-09 DIAGNOSIS — N3000 Acute cystitis without hematuria: Secondary | ICD-10-CM | POA: Insufficient documentation

## 2017-04-09 DIAGNOSIS — I255 Ischemic cardiomyopathy: Secondary | ICD-10-CM | POA: Diagnosis not present

## 2017-04-09 DIAGNOSIS — I481 Persistent atrial fibrillation: Secondary | ICD-10-CM | POA: Diagnosis not present

## 2017-04-09 DIAGNOSIS — R748 Abnormal levels of other serum enzymes: Secondary | ICD-10-CM | POA: Diagnosis not present

## 2017-04-09 DIAGNOSIS — I5189 Other ill-defined heart diseases: Secondary | ICD-10-CM | POA: Diagnosis not present

## 2017-04-09 DIAGNOSIS — I517 Cardiomegaly: Secondary | ICD-10-CM | POA: Diagnosis not present

## 2017-04-09 DIAGNOSIS — N183 Chronic kidney disease, stage 3 (moderate): Secondary | ICD-10-CM | POA: Diagnosis not present

## 2017-04-29 DIAGNOSIS — Z4502 Encounter for adjustment and management of automatic implantable cardiac defibrillator: Secondary | ICD-10-CM | POA: Diagnosis not present

## 2017-04-29 DIAGNOSIS — I481 Persistent atrial fibrillation: Secondary | ICD-10-CM | POA: Diagnosis not present

## 2017-04-29 DIAGNOSIS — I5022 Chronic systolic (congestive) heart failure: Secondary | ICD-10-CM | POA: Diagnosis not present

## 2017-04-29 DIAGNOSIS — I255 Ischemic cardiomyopathy: Secondary | ICD-10-CM | POA: Diagnosis not present

## 2017-05-06 DIAGNOSIS — I481 Persistent atrial fibrillation: Secondary | ICD-10-CM | POA: Diagnosis not present

## 2017-06-30 ENCOUNTER — Ambulatory Visit: Payer: Non-veteran care | Admitting: Podiatry

## 2017-07-09 DIAGNOSIS — R0602 Shortness of breath: Secondary | ICD-10-CM | POA: Diagnosis not present

## 2017-07-09 DIAGNOSIS — I255 Ischemic cardiomyopathy: Secondary | ICD-10-CM | POA: Diagnosis not present

## 2017-07-09 DIAGNOSIS — I472 Ventricular tachycardia: Secondary | ICD-10-CM | POA: Diagnosis not present

## 2017-07-09 DIAGNOSIS — I484 Atypical atrial flutter: Secondary | ICD-10-CM | POA: Diagnosis not present

## 2017-07-09 DIAGNOSIS — I481 Persistent atrial fibrillation: Secondary | ICD-10-CM | POA: Diagnosis not present

## 2017-07-12 ENCOUNTER — Other Ambulatory Visit: Payer: Self-pay

## 2017-07-12 ENCOUNTER — Ambulatory Visit (INDEPENDENT_AMBULATORY_CARE_PROVIDER_SITE_OTHER): Payer: Medicare HMO | Admitting: Podiatry

## 2017-07-12 DIAGNOSIS — I255 Ischemic cardiomyopathy: Secondary | ICD-10-CM | POA: Insufficient documentation

## 2017-07-12 DIAGNOSIS — B351 Tinea unguium: Secondary | ICD-10-CM

## 2017-07-12 DIAGNOSIS — M79676 Pain in unspecified toe(s): Secondary | ICD-10-CM | POA: Diagnosis not present

## 2017-07-12 DIAGNOSIS — I2510 Atherosclerotic heart disease of native coronary artery without angina pectoris: Secondary | ICD-10-CM | POA: Insufficient documentation

## 2017-07-13 NOTE — Progress Notes (Signed)
   SUBJECTIVE Patient presents to office today complaining of elongated, thickened nails that cause pain while ambulating in shoes. He is unable to trim his own nails. He is currently on anticoagulant therapy. Patient is here for further evaluation and treatment.  No past medical history on file.  OBJECTIVE General Patient is awake, alert, and oriented x 3 and in no acute distress. Derm Skin is dry and supple bilateral. Negative open lesions or macerations. Remaining integument unremarkable. Nails are tender, long, thickened and dystrophic with subungual debris, consistent with onychomycosis, 1-5 bilateral. No signs of infection noted. Vasc  DP and PT pedal pulses palpable bilaterally. Temperature gradient within normal limits.  Neuro Epicritic and protective threshold sensation grossly intact bilaterally.  Musculoskeletal Exam No symptomatic pedal deformities noted bilateral. Muscular strength within normal limits.  ASSESSMENT 1. Onychodystrophic nails 1-5 bilateral with hyperkeratosis of nails.  2. Onychomycosis of nail due to dermatophyte bilateral 3. Pain in foot bilateral  PLAN OF CARE 1. Patient evaluated today.  2. Instructed to maintain good pedal hygiene and foot care.  3. Mechanical debridement of nails 1-5 bilaterally performed using a nail nipper. Filed with dremel without incident.  4. Return to clinic in 3 mos.    Felecia Shelling, DPM [facility]  Dr. Felecia Shelling, DPM    [address]                                        [place], [place] [address]                Office [phone]  Fax [phone]

## 2017-08-02 DIAGNOSIS — I255 Ischemic cardiomyopathy: Secondary | ICD-10-CM | POA: Diagnosis not present

## 2017-08-02 DIAGNOSIS — I2510 Atherosclerotic heart disease of native coronary artery without angina pectoris: Secondary | ICD-10-CM | POA: Diagnosis not present

## 2017-08-02 DIAGNOSIS — I481 Persistent atrial fibrillation: Secondary | ICD-10-CM | POA: Diagnosis not present

## 2017-08-02 DIAGNOSIS — I472 Ventricular tachycardia: Secondary | ICD-10-CM | POA: Diagnosis not present

## 2017-08-02 DIAGNOSIS — I484 Atypical atrial flutter: Secondary | ICD-10-CM | POA: Diagnosis not present

## 2017-08-04 DIAGNOSIS — Z4502 Encounter for adjustment and management of automatic implantable cardiac defibrillator: Secondary | ICD-10-CM | POA: Diagnosis not present

## 2017-08-26 DIAGNOSIS — I255 Ischemic cardiomyopathy: Secondary | ICD-10-CM | POA: Diagnosis not present

## 2017-08-26 DIAGNOSIS — R0602 Shortness of breath: Secondary | ICD-10-CM | POA: Diagnosis not present

## 2017-08-26 DIAGNOSIS — I5022 Chronic systolic (congestive) heart failure: Secondary | ICD-10-CM | POA: Diagnosis not present

## 2017-08-26 DIAGNOSIS — I420 Dilated cardiomyopathy: Secondary | ICD-10-CM | POA: Diagnosis not present

## 2017-08-26 DIAGNOSIS — I481 Persistent atrial fibrillation: Secondary | ICD-10-CM | POA: Diagnosis not present

## 2017-08-26 DIAGNOSIS — I482 Chronic atrial fibrillation: Secondary | ICD-10-CM | POA: Diagnosis not present

## 2017-08-30 ENCOUNTER — Telehealth: Payer: Self-pay | Admitting: Podiatry

## 2017-08-30 NOTE — Telephone Encounter (Signed)
Please fax records for pts NP visit to [facility], fax number is [phone].

## 2017-09-21 DIAGNOSIS — I472 Ventricular tachycardia: Secondary | ICD-10-CM | POA: Diagnosis not present

## 2017-09-21 DIAGNOSIS — I2510 Atherosclerotic heart disease of native coronary artery without angina pectoris: Secondary | ICD-10-CM | POA: Diagnosis not present

## 2017-09-21 DIAGNOSIS — I255 Ischemic cardiomyopathy: Secondary | ICD-10-CM | POA: Diagnosis not present

## 2017-09-21 DIAGNOSIS — I481 Persistent atrial fibrillation: Secondary | ICD-10-CM | POA: Diagnosis not present

## 2017-10-01 DIAGNOSIS — J984 Other disorders of lung: Secondary | ICD-10-CM | POA: Diagnosis not present

## 2017-10-01 DIAGNOSIS — I4891 Unspecified atrial fibrillation: Secondary | ICD-10-CM | POA: Diagnosis not present

## 2017-10-01 DIAGNOSIS — R0602 Shortness of breath: Secondary | ICD-10-CM | POA: Diagnosis not present

## 2017-10-01 DIAGNOSIS — R600 Localized edema: Secondary | ICD-10-CM | POA: Diagnosis not present

## 2017-10-01 DIAGNOSIS — I4510 Unspecified right bundle-branch block: Secondary | ICD-10-CM | POA: Diagnosis not present

## 2017-10-01 DIAGNOSIS — Z7982 Long term (current) use of aspirin: Secondary | ICD-10-CM | POA: Diagnosis not present

## 2017-10-01 DIAGNOSIS — R918 Other nonspecific abnormal finding of lung field: Secondary | ICD-10-CM | POA: Diagnosis not present

## 2017-10-01 DIAGNOSIS — Z79899 Other long term (current) drug therapy: Secondary | ICD-10-CM | POA: Diagnosis not present

## 2017-10-01 DIAGNOSIS — I252 Old myocardial infarction: Secondary | ICD-10-CM | POA: Diagnosis not present

## 2017-10-01 DIAGNOSIS — R599 Enlarged lymph nodes, unspecified: Secondary | ICD-10-CM | POA: Diagnosis not present

## 2017-10-01 DIAGNOSIS — I517 Cardiomegaly: Secondary | ICD-10-CM | POA: Diagnosis not present

## 2017-10-01 DIAGNOSIS — Z7901 Long term (current) use of anticoagulants: Secondary | ICD-10-CM | POA: Diagnosis not present

## 2017-10-01 DIAGNOSIS — I447 Left bundle-branch block, unspecified: Secondary | ICD-10-CM | POA: Diagnosis not present

## 2017-10-01 DIAGNOSIS — N3000 Acute cystitis without hematuria: Secondary | ICD-10-CM | POA: Diagnosis not present

## 2017-10-01 DIAGNOSIS — Z87891 Personal history of nicotine dependence: Secondary | ICD-10-CM | POA: Diagnosis not present

## 2017-10-01 DIAGNOSIS — I509 Heart failure, unspecified: Secondary | ICD-10-CM | POA: Diagnosis not present

## 2017-10-01 DIAGNOSIS — N3090 Cystitis, unspecified without hematuria: Secondary | ICD-10-CM | POA: Diagnosis not present

## 2017-10-11 ENCOUNTER — Encounter: Payer: Non-veteran care | Admitting: Podiatry

## 2017-10-13 NOTE — Progress Notes (Signed)
This encounter was created in error - please disregard.

## 2017-10-28 DIAGNOSIS — D649 Anemia, unspecified: Secondary | ICD-10-CM | POA: Diagnosis not present

## 2017-10-28 DIAGNOSIS — N390 Urinary tract infection, site not specified: Secondary | ICD-10-CM | POA: Diagnosis not present

## 2017-10-28 DIAGNOSIS — I10 Essential (primary) hypertension: Secondary | ICD-10-CM | POA: Diagnosis not present

## 2017-10-28 DIAGNOSIS — Z87440 Personal history of urinary (tract) infections: Secondary | ICD-10-CM | POA: Diagnosis not present

## 2017-10-28 DIAGNOSIS — I509 Heart failure, unspecified: Secondary | ICD-10-CM | POA: Diagnosis not present

## 2017-11-03 DIAGNOSIS — I255 Ischemic cardiomyopathy: Secondary | ICD-10-CM | POA: Diagnosis not present

## 2017-11-11 DIAGNOSIS — N390 Urinary tract infection, site not specified: Secondary | ICD-10-CM | POA: Diagnosis not present

## 2017-11-18 DIAGNOSIS — R3121 Asymptomatic microscopic hematuria: Secondary | ICD-10-CM | POA: Diagnosis not present

## 2017-11-22 DIAGNOSIS — R0609 Other forms of dyspnea: Secondary | ICD-10-CM | POA: Diagnosis not present

## 2017-11-22 DIAGNOSIS — Z95810 Presence of automatic (implantable) cardiac defibrillator: Secondary | ICD-10-CM | POA: Diagnosis not present

## 2017-11-22 DIAGNOSIS — R05 Cough: Secondary | ICD-10-CM | POA: Diagnosis not present

## 2017-11-22 DIAGNOSIS — I509 Heart failure, unspecified: Secondary | ICD-10-CM | POA: Diagnosis not present

## 2017-11-22 DIAGNOSIS — R918 Other nonspecific abnormal finding of lung field: Secondary | ICD-10-CM | POA: Diagnosis not present

## 2017-11-22 DIAGNOSIS — R2243 Localized swelling, mass and lump, lower limb, bilateral: Secondary | ICD-10-CM | POA: Diagnosis not present

## 2017-11-22 DIAGNOSIS — J810 Acute pulmonary edema: Secondary | ICD-10-CM | POA: Diagnosis not present

## 2017-11-22 DIAGNOSIS — J811 Chronic pulmonary edema: Secondary | ICD-10-CM | POA: Diagnosis not present

## 2017-11-22 DIAGNOSIS — Z79899 Other long term (current) drug therapy: Secondary | ICD-10-CM | POA: Diagnosis not present

## 2017-11-22 DIAGNOSIS — I255 Ischemic cardiomyopathy: Secondary | ICD-10-CM | POA: Diagnosis not present

## 2017-11-22 DIAGNOSIS — I272 Pulmonary hypertension, unspecified: Secondary | ICD-10-CM | POA: Diagnosis not present

## 2017-11-22 DIAGNOSIS — Z7982 Long term (current) use of aspirin: Secondary | ICD-10-CM | POA: Diagnosis not present

## 2017-11-22 DIAGNOSIS — I361 Nonrheumatic tricuspid (valve) insufficiency: Secondary | ICD-10-CM | POA: Diagnosis not present

## 2017-11-22 DIAGNOSIS — I340 Nonrheumatic mitral (valve) insufficiency: Secondary | ICD-10-CM | POA: Diagnosis not present

## 2017-11-22 DIAGNOSIS — J9811 Atelectasis: Secondary | ICD-10-CM | POA: Diagnosis not present

## 2017-11-22 DIAGNOSIS — R0602 Shortness of breath: Secondary | ICD-10-CM | POA: Diagnosis not present

## 2017-11-22 DIAGNOSIS — I481 Persistent atrial fibrillation: Secondary | ICD-10-CM | POA: Diagnosis not present

## 2017-11-22 DIAGNOSIS — Z23 Encounter for immunization: Secondary | ICD-10-CM | POA: Diagnosis not present

## 2017-11-22 DIAGNOSIS — R0689 Other abnormalities of breathing: Secondary | ICD-10-CM | POA: Diagnosis not present

## 2017-11-23 DIAGNOSIS — R2243 Localized swelling, mass and lump, lower limb, bilateral: Secondary | ICD-10-CM | POA: Diagnosis not present

## 2017-11-23 DIAGNOSIS — R0609 Other forms of dyspnea: Secondary | ICD-10-CM | POA: Diagnosis not present

## 2017-12-09 DIAGNOSIS — I2510 Atherosclerotic heart disease of native coronary artery without angina pectoris: Secondary | ICD-10-CM | POA: Diagnosis not present

## 2017-12-09 DIAGNOSIS — I481 Persistent atrial fibrillation: Secondary | ICD-10-CM | POA: Diagnosis not present

## 2017-12-09 DIAGNOSIS — I255 Ischemic cardiomyopathy: Secondary | ICD-10-CM | POA: Diagnosis not present

## 2017-12-10 DIAGNOSIS — R748 Abnormal levels of other serum enzymes: Secondary | ICD-10-CM | POA: Diagnosis not present

## 2017-12-10 DIAGNOSIS — I248 Other forms of acute ischemic heart disease: Secondary | ICD-10-CM | POA: Diagnosis not present

## 2017-12-10 DIAGNOSIS — E871 Hypo-osmolality and hyponatremia: Secondary | ICD-10-CM | POA: Diagnosis not present

## 2017-12-10 DIAGNOSIS — I509 Heart failure, unspecified: Secondary | ICD-10-CM | POA: Diagnosis not present

## 2017-12-10 DIAGNOSIS — I2510 Atherosclerotic heart disease of native coronary artery without angina pectoris: Secondary | ICD-10-CM | POA: Diagnosis not present

## 2017-12-10 DIAGNOSIS — I481 Persistent atrial fibrillation: Secondary | ICD-10-CM | POA: Diagnosis not present

## 2017-12-10 DIAGNOSIS — Z87891 Personal history of nicotine dependence: Secondary | ICD-10-CM | POA: Diagnosis not present

## 2017-12-10 DIAGNOSIS — E781 Pure hyperglyceridemia: Secondary | ICD-10-CM | POA: Diagnosis not present

## 2017-12-10 DIAGNOSIS — I255 Ischemic cardiomyopathy: Secondary | ICD-10-CM | POA: Diagnosis not present

## 2017-12-10 DIAGNOSIS — N183 Chronic kidney disease, stage 3 (moderate): Secondary | ICD-10-CM | POA: Diagnosis not present

## 2017-12-10 DIAGNOSIS — Z7901 Long term (current) use of anticoagulants: Secondary | ICD-10-CM | POA: Diagnosis not present

## 2017-12-10 DIAGNOSIS — R0602 Shortness of breath: Secondary | ICD-10-CM | POA: Diagnosis not present

## 2017-12-10 DIAGNOSIS — I4891 Unspecified atrial fibrillation: Secondary | ICD-10-CM | POA: Diagnosis not present

## 2017-12-10 DIAGNOSIS — I110 Hypertensive heart disease with heart failure: Secondary | ICD-10-CM | POA: Diagnosis not present

## 2017-12-10 DIAGNOSIS — I5023 Acute on chronic systolic (congestive) heart failure: Secondary | ICD-10-CM | POA: Diagnosis not present

## 2017-12-10 DIAGNOSIS — Z95810 Presence of automatic (implantable) cardiac defibrillator: Secondary | ICD-10-CM | POA: Diagnosis not present

## 2017-12-10 DIAGNOSIS — R0789 Other chest pain: Secondary | ICD-10-CM | POA: Diagnosis not present

## 2017-12-10 DIAGNOSIS — I493 Ventricular premature depolarization: Secondary | ICD-10-CM | POA: Diagnosis not present

## 2017-12-10 DIAGNOSIS — E785 Hyperlipidemia, unspecified: Secondary | ICD-10-CM | POA: Diagnosis not present

## 2017-12-10 DIAGNOSIS — R0902 Hypoxemia: Secondary | ICD-10-CM | POA: Diagnosis not present

## 2017-12-10 DIAGNOSIS — R7989 Other specified abnormal findings of blood chemistry: Secondary | ICD-10-CM | POA: Diagnosis not present

## 2017-12-10 DIAGNOSIS — I130 Hypertensive heart and chronic kidney disease with heart failure and stage 1 through stage 4 chronic kidney disease, or unspecified chronic kidney disease: Secondary | ICD-10-CM | POA: Diagnosis not present

## 2017-12-15 DIAGNOSIS — I255 Ischemic cardiomyopathy: Secondary | ICD-10-CM | POA: Diagnosis not present

## 2017-12-15 DIAGNOSIS — I110 Hypertensive heart disease with heart failure: Secondary | ICD-10-CM | POA: Diagnosis not present

## 2017-12-15 DIAGNOSIS — I4891 Unspecified atrial fibrillation: Secondary | ICD-10-CM | POA: Diagnosis not present

## 2017-12-15 DIAGNOSIS — Z7901 Long term (current) use of anticoagulants: Secondary | ICD-10-CM | POA: Diagnosis not present

## 2017-12-15 DIAGNOSIS — E785 Hyperlipidemia, unspecified: Secondary | ICD-10-CM | POA: Diagnosis not present

## 2017-12-15 DIAGNOSIS — Z7982 Long term (current) use of aspirin: Secondary | ICD-10-CM | POA: Diagnosis not present

## 2017-12-15 DIAGNOSIS — I2510 Atherosclerotic heart disease of native coronary artery without angina pectoris: Secondary | ICD-10-CM | POA: Diagnosis not present

## 2017-12-15 DIAGNOSIS — I5023 Acute on chronic systolic (congestive) heart failure: Secondary | ICD-10-CM | POA: Diagnosis not present

## 2017-12-15 DIAGNOSIS — I272 Pulmonary hypertension, unspecified: Secondary | ICD-10-CM | POA: Diagnosis not present

## 2017-12-20 DIAGNOSIS — E785 Hyperlipidemia, unspecified: Secondary | ICD-10-CM | POA: Diagnosis not present

## 2017-12-20 DIAGNOSIS — I2510 Atherosclerotic heart disease of native coronary artery without angina pectoris: Secondary | ICD-10-CM | POA: Diagnosis not present

## 2017-12-20 DIAGNOSIS — Z7901 Long term (current) use of anticoagulants: Secondary | ICD-10-CM | POA: Diagnosis not present

## 2017-12-20 DIAGNOSIS — I272 Pulmonary hypertension, unspecified: Secondary | ICD-10-CM | POA: Diagnosis not present

## 2017-12-20 DIAGNOSIS — I110 Hypertensive heart disease with heart failure: Secondary | ICD-10-CM | POA: Diagnosis not present

## 2017-12-20 DIAGNOSIS — I255 Ischemic cardiomyopathy: Secondary | ICD-10-CM | POA: Diagnosis not present

## 2017-12-20 DIAGNOSIS — I5023 Acute on chronic systolic (congestive) heart failure: Secondary | ICD-10-CM | POA: Diagnosis not present

## 2017-12-20 DIAGNOSIS — I4891 Unspecified atrial fibrillation: Secondary | ICD-10-CM | POA: Diagnosis not present

## 2017-12-20 DIAGNOSIS — Z7982 Long term (current) use of aspirin: Secondary | ICD-10-CM | POA: Diagnosis not present

## 2017-12-21 DIAGNOSIS — I4891 Unspecified atrial fibrillation: Secondary | ICD-10-CM | POA: Diagnosis not present

## 2017-12-21 DIAGNOSIS — Z7901 Long term (current) use of anticoagulants: Secondary | ICD-10-CM | POA: Diagnosis not present

## 2017-12-21 DIAGNOSIS — I255 Ischemic cardiomyopathy: Secondary | ICD-10-CM | POA: Diagnosis not present

## 2017-12-21 DIAGNOSIS — I110 Hypertensive heart disease with heart failure: Secondary | ICD-10-CM | POA: Diagnosis not present

## 2017-12-21 DIAGNOSIS — I272 Pulmonary hypertension, unspecified: Secondary | ICD-10-CM | POA: Diagnosis not present

## 2017-12-21 DIAGNOSIS — I2510 Atherosclerotic heart disease of native coronary artery without angina pectoris: Secondary | ICD-10-CM | POA: Diagnosis not present

## 2017-12-21 DIAGNOSIS — I5023 Acute on chronic systolic (congestive) heart failure: Secondary | ICD-10-CM | POA: Diagnosis not present

## 2017-12-21 DIAGNOSIS — Z7982 Long term (current) use of aspirin: Secondary | ICD-10-CM | POA: Diagnosis not present

## 2017-12-21 DIAGNOSIS — E785 Hyperlipidemia, unspecified: Secondary | ICD-10-CM | POA: Diagnosis not present

## 2017-12-23 DIAGNOSIS — Z7901 Long term (current) use of anticoagulants: Secondary | ICD-10-CM | POA: Diagnosis not present

## 2017-12-23 DIAGNOSIS — I4891 Unspecified atrial fibrillation: Secondary | ICD-10-CM | POA: Diagnosis not present

## 2017-12-23 DIAGNOSIS — I272 Pulmonary hypertension, unspecified: Secondary | ICD-10-CM | POA: Diagnosis not present

## 2017-12-23 DIAGNOSIS — I110 Hypertensive heart disease with heart failure: Secondary | ICD-10-CM | POA: Diagnosis not present

## 2017-12-23 DIAGNOSIS — E785 Hyperlipidemia, unspecified: Secondary | ICD-10-CM | POA: Diagnosis not present

## 2017-12-23 DIAGNOSIS — Z7982 Long term (current) use of aspirin: Secondary | ICD-10-CM | POA: Diagnosis not present

## 2017-12-23 DIAGNOSIS — I2510 Atherosclerotic heart disease of native coronary artery without angina pectoris: Secondary | ICD-10-CM | POA: Diagnosis not present

## 2017-12-23 DIAGNOSIS — I5023 Acute on chronic systolic (congestive) heart failure: Secondary | ICD-10-CM | POA: Diagnosis not present

## 2017-12-23 DIAGNOSIS — I255 Ischemic cardiomyopathy: Secondary | ICD-10-CM | POA: Diagnosis not present

## 2017-12-28 DIAGNOSIS — I5023 Acute on chronic systolic (congestive) heart failure: Secondary | ICD-10-CM | POA: Diagnosis not present

## 2017-12-28 DIAGNOSIS — I2510 Atherosclerotic heart disease of native coronary artery without angina pectoris: Secondary | ICD-10-CM | POA: Diagnosis not present

## 2017-12-28 DIAGNOSIS — I4891 Unspecified atrial fibrillation: Secondary | ICD-10-CM | POA: Diagnosis not present

## 2017-12-28 DIAGNOSIS — Z7982 Long term (current) use of aspirin: Secondary | ICD-10-CM | POA: Diagnosis not present

## 2017-12-28 DIAGNOSIS — E785 Hyperlipidemia, unspecified: Secondary | ICD-10-CM | POA: Diagnosis not present

## 2017-12-28 DIAGNOSIS — Z7901 Long term (current) use of anticoagulants: Secondary | ICD-10-CM | POA: Diagnosis not present

## 2017-12-28 DIAGNOSIS — I255 Ischemic cardiomyopathy: Secondary | ICD-10-CM | POA: Diagnosis not present

## 2017-12-28 DIAGNOSIS — I272 Pulmonary hypertension, unspecified: Secondary | ICD-10-CM | POA: Diagnosis not present

## 2017-12-28 DIAGNOSIS — I110 Hypertensive heart disease with heart failure: Secondary | ICD-10-CM | POA: Diagnosis not present

## 2018-01-10 DIAGNOSIS — I110 Hypertensive heart disease with heart failure: Secondary | ICD-10-CM | POA: Diagnosis not present

## 2018-01-10 DIAGNOSIS — I4891 Unspecified atrial fibrillation: Secondary | ICD-10-CM | POA: Diagnosis not present

## 2018-01-10 DIAGNOSIS — I2510 Atherosclerotic heart disease of native coronary artery without angina pectoris: Secondary | ICD-10-CM | POA: Diagnosis not present

## 2018-01-10 DIAGNOSIS — E785 Hyperlipidemia, unspecified: Secondary | ICD-10-CM | POA: Diagnosis not present

## 2018-01-10 DIAGNOSIS — Z7901 Long term (current) use of anticoagulants: Secondary | ICD-10-CM | POA: Diagnosis not present

## 2018-01-10 DIAGNOSIS — I255 Ischemic cardiomyopathy: Secondary | ICD-10-CM | POA: Diagnosis not present

## 2018-01-10 DIAGNOSIS — Z7982 Long term (current) use of aspirin: Secondary | ICD-10-CM | POA: Diagnosis not present

## 2018-01-10 DIAGNOSIS — I5023 Acute on chronic systolic (congestive) heart failure: Secondary | ICD-10-CM | POA: Diagnosis not present

## 2018-01-10 DIAGNOSIS — I272 Pulmonary hypertension, unspecified: Secondary | ICD-10-CM | POA: Diagnosis not present

## 2018-01-11 DIAGNOSIS — Z7982 Long term (current) use of aspirin: Secondary | ICD-10-CM | POA: Diagnosis not present

## 2018-01-11 DIAGNOSIS — I5023 Acute on chronic systolic (congestive) heart failure: Secondary | ICD-10-CM | POA: Diagnosis not present

## 2018-01-11 DIAGNOSIS — I2510 Atherosclerotic heart disease of native coronary artery without angina pectoris: Secondary | ICD-10-CM | POA: Diagnosis not present

## 2018-01-11 DIAGNOSIS — I255 Ischemic cardiomyopathy: Secondary | ICD-10-CM | POA: Diagnosis not present

## 2018-01-11 DIAGNOSIS — Z7901 Long term (current) use of anticoagulants: Secondary | ICD-10-CM | POA: Diagnosis not present

## 2018-01-11 DIAGNOSIS — I110 Hypertensive heart disease with heart failure: Secondary | ICD-10-CM | POA: Diagnosis not present

## 2018-01-11 DIAGNOSIS — E785 Hyperlipidemia, unspecified: Secondary | ICD-10-CM | POA: Diagnosis not present

## 2018-01-11 DIAGNOSIS — I4891 Unspecified atrial fibrillation: Secondary | ICD-10-CM | POA: Diagnosis not present

## 2018-01-11 DIAGNOSIS — I272 Pulmonary hypertension, unspecified: Secondary | ICD-10-CM | POA: Diagnosis not present

## 2018-01-12 DIAGNOSIS — I2510 Atherosclerotic heart disease of native coronary artery without angina pectoris: Secondary | ICD-10-CM | POA: Diagnosis not present

## 2018-01-12 DIAGNOSIS — I255 Ischemic cardiomyopathy: Secondary | ICD-10-CM | POA: Diagnosis not present

## 2018-01-12 DIAGNOSIS — I4891 Unspecified atrial fibrillation: Secondary | ICD-10-CM | POA: Diagnosis not present

## 2018-01-12 DIAGNOSIS — I5023 Acute on chronic systolic (congestive) heart failure: Secondary | ICD-10-CM | POA: Diagnosis not present

## 2018-01-12 DIAGNOSIS — Z7901 Long term (current) use of anticoagulants: Secondary | ICD-10-CM | POA: Diagnosis not present

## 2018-01-12 DIAGNOSIS — E785 Hyperlipidemia, unspecified: Secondary | ICD-10-CM | POA: Diagnosis not present

## 2018-01-12 DIAGNOSIS — Z7982 Long term (current) use of aspirin: Secondary | ICD-10-CM | POA: Diagnosis not present

## 2018-01-12 DIAGNOSIS — I272 Pulmonary hypertension, unspecified: Secondary | ICD-10-CM | POA: Diagnosis not present

## 2018-01-12 DIAGNOSIS — I110 Hypertensive heart disease with heart failure: Secondary | ICD-10-CM | POA: Diagnosis not present

## 2018-01-17 DIAGNOSIS — I2510 Atherosclerotic heart disease of native coronary artery without angina pectoris: Secondary | ICD-10-CM | POA: Diagnosis not present

## 2018-01-17 DIAGNOSIS — I272 Pulmonary hypertension, unspecified: Secondary | ICD-10-CM | POA: Diagnosis not present

## 2018-01-17 DIAGNOSIS — Z7982 Long term (current) use of aspirin: Secondary | ICD-10-CM | POA: Diagnosis not present

## 2018-01-17 DIAGNOSIS — I255 Ischemic cardiomyopathy: Secondary | ICD-10-CM | POA: Diagnosis not present

## 2018-01-17 DIAGNOSIS — I5023 Acute on chronic systolic (congestive) heart failure: Secondary | ICD-10-CM | POA: Diagnosis not present

## 2018-01-17 DIAGNOSIS — I110 Hypertensive heart disease with heart failure: Secondary | ICD-10-CM | POA: Diagnosis not present

## 2018-01-17 DIAGNOSIS — I4891 Unspecified atrial fibrillation: Secondary | ICD-10-CM | POA: Diagnosis not present

## 2018-01-17 DIAGNOSIS — E785 Hyperlipidemia, unspecified: Secondary | ICD-10-CM | POA: Diagnosis not present

## 2018-01-17 DIAGNOSIS — Z7901 Long term (current) use of anticoagulants: Secondary | ICD-10-CM | POA: Diagnosis not present

## 2018-01-19 DIAGNOSIS — E785 Hyperlipidemia, unspecified: Secondary | ICD-10-CM | POA: Diagnosis not present

## 2018-01-19 DIAGNOSIS — Z7982 Long term (current) use of aspirin: Secondary | ICD-10-CM | POA: Diagnosis not present

## 2018-01-19 DIAGNOSIS — I4891 Unspecified atrial fibrillation: Secondary | ICD-10-CM | POA: Diagnosis not present

## 2018-01-19 DIAGNOSIS — I2510 Atherosclerotic heart disease of native coronary artery without angina pectoris: Secondary | ICD-10-CM | POA: Diagnosis not present

## 2018-01-19 DIAGNOSIS — I110 Hypertensive heart disease with heart failure: Secondary | ICD-10-CM | POA: Diagnosis not present

## 2018-01-19 DIAGNOSIS — I272 Pulmonary hypertension, unspecified: Secondary | ICD-10-CM | POA: Diagnosis not present

## 2018-01-19 DIAGNOSIS — Z7901 Long term (current) use of anticoagulants: Secondary | ICD-10-CM | POA: Diagnosis not present

## 2018-01-19 DIAGNOSIS — I255 Ischemic cardiomyopathy: Secondary | ICD-10-CM | POA: Diagnosis not present

## 2018-01-19 DIAGNOSIS — I5023 Acute on chronic systolic (congestive) heart failure: Secondary | ICD-10-CM | POA: Diagnosis not present

## 2018-01-24 DIAGNOSIS — I2510 Atherosclerotic heart disease of native coronary artery without angina pectoris: Secondary | ICD-10-CM | POA: Diagnosis not present

## 2018-01-24 DIAGNOSIS — I110 Hypertensive heart disease with heart failure: Secondary | ICD-10-CM | POA: Diagnosis not present

## 2018-01-24 DIAGNOSIS — I5023 Acute on chronic systolic (congestive) heart failure: Secondary | ICD-10-CM | POA: Diagnosis not present

## 2018-01-24 DIAGNOSIS — Z7982 Long term (current) use of aspirin: Secondary | ICD-10-CM | POA: Diagnosis not present

## 2018-01-24 DIAGNOSIS — E785 Hyperlipidemia, unspecified: Secondary | ICD-10-CM | POA: Diagnosis not present

## 2018-01-24 DIAGNOSIS — I255 Ischemic cardiomyopathy: Secondary | ICD-10-CM | POA: Diagnosis not present

## 2018-01-24 DIAGNOSIS — Z7901 Long term (current) use of anticoagulants: Secondary | ICD-10-CM | POA: Diagnosis not present

## 2018-01-24 DIAGNOSIS — I4891 Unspecified atrial fibrillation: Secondary | ICD-10-CM | POA: Diagnosis not present

## 2018-01-24 DIAGNOSIS — I272 Pulmonary hypertension, unspecified: Secondary | ICD-10-CM | POA: Diagnosis not present

## 2018-01-25 DIAGNOSIS — I4891 Unspecified atrial fibrillation: Secondary | ICD-10-CM | POA: Diagnosis not present

## 2018-01-25 DIAGNOSIS — I5023 Acute on chronic systolic (congestive) heart failure: Secondary | ICD-10-CM | POA: Diagnosis not present

## 2018-01-25 DIAGNOSIS — Z7982 Long term (current) use of aspirin: Secondary | ICD-10-CM | POA: Diagnosis not present

## 2018-01-25 DIAGNOSIS — I2510 Atherosclerotic heart disease of native coronary artery without angina pectoris: Secondary | ICD-10-CM | POA: Diagnosis not present

## 2018-01-25 DIAGNOSIS — Z7901 Long term (current) use of anticoagulants: Secondary | ICD-10-CM | POA: Diagnosis not present

## 2018-01-25 DIAGNOSIS — E785 Hyperlipidemia, unspecified: Secondary | ICD-10-CM | POA: Diagnosis not present

## 2018-01-25 DIAGNOSIS — I110 Hypertensive heart disease with heart failure: Secondary | ICD-10-CM | POA: Diagnosis not present

## 2018-01-25 DIAGNOSIS — I272 Pulmonary hypertension, unspecified: Secondary | ICD-10-CM | POA: Diagnosis not present

## 2018-01-25 DIAGNOSIS — I255 Ischemic cardiomyopathy: Secondary | ICD-10-CM | POA: Diagnosis not present

## 2018-01-26 DIAGNOSIS — I5023 Acute on chronic systolic (congestive) heart failure: Secondary | ICD-10-CM | POA: Diagnosis not present

## 2018-01-26 DIAGNOSIS — Z7901 Long term (current) use of anticoagulants: Secondary | ICD-10-CM | POA: Diagnosis not present

## 2018-01-26 DIAGNOSIS — I255 Ischemic cardiomyopathy: Secondary | ICD-10-CM | POA: Diagnosis not present

## 2018-01-26 DIAGNOSIS — I4891 Unspecified atrial fibrillation: Secondary | ICD-10-CM | POA: Diagnosis not present

## 2018-01-26 DIAGNOSIS — I2510 Atherosclerotic heart disease of native coronary artery without angina pectoris: Secondary | ICD-10-CM | POA: Diagnosis not present

## 2018-01-26 DIAGNOSIS — I272 Pulmonary hypertension, unspecified: Secondary | ICD-10-CM | POA: Diagnosis not present

## 2018-01-26 DIAGNOSIS — E785 Hyperlipidemia, unspecified: Secondary | ICD-10-CM | POA: Diagnosis not present

## 2018-01-26 DIAGNOSIS — I110 Hypertensive heart disease with heart failure: Secondary | ICD-10-CM | POA: Diagnosis not present

## 2018-01-26 DIAGNOSIS — Z7982 Long term (current) use of aspirin: Secondary | ICD-10-CM | POA: Diagnosis not present

## 2018-01-31 DIAGNOSIS — I255 Ischemic cardiomyopathy: Secondary | ICD-10-CM | POA: Diagnosis not present

## 2018-01-31 DIAGNOSIS — I420 Dilated cardiomyopathy: Secondary | ICD-10-CM | POA: Diagnosis not present

## 2018-01-31 DIAGNOSIS — I484 Atypical atrial flutter: Secondary | ICD-10-CM | POA: Diagnosis not present

## 2018-01-31 DIAGNOSIS — I5022 Chronic systolic (congestive) heart failure: Secondary | ICD-10-CM | POA: Diagnosis not present

## 2018-02-01 DIAGNOSIS — Z7901 Long term (current) use of anticoagulants: Secondary | ICD-10-CM | POA: Diagnosis not present

## 2018-02-01 DIAGNOSIS — I255 Ischemic cardiomyopathy: Secondary | ICD-10-CM | POA: Diagnosis not present

## 2018-02-01 DIAGNOSIS — I272 Pulmonary hypertension, unspecified: Secondary | ICD-10-CM | POA: Diagnosis not present

## 2018-02-01 DIAGNOSIS — I5023 Acute on chronic systolic (congestive) heart failure: Secondary | ICD-10-CM | POA: Diagnosis not present

## 2018-02-01 DIAGNOSIS — I2510 Atherosclerotic heart disease of native coronary artery without angina pectoris: Secondary | ICD-10-CM | POA: Diagnosis not present

## 2018-02-01 DIAGNOSIS — I4891 Unspecified atrial fibrillation: Secondary | ICD-10-CM | POA: Diagnosis not present

## 2018-02-01 DIAGNOSIS — E785 Hyperlipidemia, unspecified: Secondary | ICD-10-CM | POA: Diagnosis not present

## 2018-02-01 DIAGNOSIS — Z7982 Long term (current) use of aspirin: Secondary | ICD-10-CM | POA: Diagnosis not present

## 2018-02-01 DIAGNOSIS — I110 Hypertensive heart disease with heart failure: Secondary | ICD-10-CM | POA: Diagnosis not present

## 2018-02-02 DIAGNOSIS — Z95810 Presence of automatic (implantable) cardiac defibrillator: Secondary | ICD-10-CM | POA: Diagnosis not present

## 2018-02-02 DIAGNOSIS — I255 Ischemic cardiomyopathy: Secondary | ICD-10-CM | POA: Diagnosis not present

## 2018-02-03 DIAGNOSIS — Z7982 Long term (current) use of aspirin: Secondary | ICD-10-CM | POA: Diagnosis not present

## 2018-02-03 DIAGNOSIS — I4891 Unspecified atrial fibrillation: Secondary | ICD-10-CM | POA: Diagnosis not present

## 2018-02-03 DIAGNOSIS — I2510 Atherosclerotic heart disease of native coronary artery without angina pectoris: Secondary | ICD-10-CM | POA: Diagnosis not present

## 2018-02-03 DIAGNOSIS — I255 Ischemic cardiomyopathy: Secondary | ICD-10-CM | POA: Diagnosis not present

## 2018-02-03 DIAGNOSIS — Z7901 Long term (current) use of anticoagulants: Secondary | ICD-10-CM | POA: Diagnosis not present

## 2018-02-03 DIAGNOSIS — E785 Hyperlipidemia, unspecified: Secondary | ICD-10-CM | POA: Diagnosis not present

## 2018-02-03 DIAGNOSIS — I110 Hypertensive heart disease with heart failure: Secondary | ICD-10-CM | POA: Diagnosis not present

## 2018-02-03 DIAGNOSIS — I5023 Acute on chronic systolic (congestive) heart failure: Secondary | ICD-10-CM | POA: Diagnosis not present

## 2018-02-03 DIAGNOSIS — I272 Pulmonary hypertension, unspecified: Secondary | ICD-10-CM | POA: Diagnosis not present

## 2018-02-07 DIAGNOSIS — I272 Pulmonary hypertension, unspecified: Secondary | ICD-10-CM | POA: Diagnosis not present

## 2018-02-07 DIAGNOSIS — I110 Hypertensive heart disease with heart failure: Secondary | ICD-10-CM | POA: Diagnosis not present

## 2018-02-07 DIAGNOSIS — I5023 Acute on chronic systolic (congestive) heart failure: Secondary | ICD-10-CM | POA: Diagnosis not present

## 2018-02-07 DIAGNOSIS — Z7901 Long term (current) use of anticoagulants: Secondary | ICD-10-CM | POA: Diagnosis not present

## 2018-02-07 DIAGNOSIS — I2510 Atherosclerotic heart disease of native coronary artery without angina pectoris: Secondary | ICD-10-CM | POA: Diagnosis not present

## 2018-02-07 DIAGNOSIS — Z7982 Long term (current) use of aspirin: Secondary | ICD-10-CM | POA: Diagnosis not present

## 2018-02-07 DIAGNOSIS — I255 Ischemic cardiomyopathy: Secondary | ICD-10-CM | POA: Diagnosis not present

## 2018-02-07 DIAGNOSIS — E785 Hyperlipidemia, unspecified: Secondary | ICD-10-CM | POA: Diagnosis not present

## 2018-02-07 DIAGNOSIS — I4891 Unspecified atrial fibrillation: Secondary | ICD-10-CM | POA: Diagnosis not present

## 2018-03-04 DIAGNOSIS — I272 Pulmonary hypertension, unspecified: Secondary | ICD-10-CM | POA: Diagnosis not present

## 2018-03-04 DIAGNOSIS — Z950 Presence of cardiac pacemaker: Secondary | ICD-10-CM | POA: Diagnosis not present

## 2018-03-04 DIAGNOSIS — I509 Heart failure, unspecified: Secondary | ICD-10-CM | POA: Diagnosis not present

## 2018-03-04 DIAGNOSIS — I2510 Atherosclerotic heart disease of native coronary artery without angina pectoris: Secondary | ICD-10-CM | POA: Diagnosis not present

## 2018-03-04 DIAGNOSIS — R0602 Shortness of breath: Secondary | ICD-10-CM | POA: Diagnosis not present

## 2018-03-04 DIAGNOSIS — K219 Gastro-esophageal reflux disease without esophagitis: Secondary | ICD-10-CM | POA: Diagnosis not present

## 2018-03-04 DIAGNOSIS — J811 Chronic pulmonary edema: Secondary | ICD-10-CM | POA: Diagnosis not present

## 2018-03-04 DIAGNOSIS — R0902 Hypoxemia: Secondary | ICD-10-CM | POA: Diagnosis not present

## 2018-03-04 DIAGNOSIS — R7989 Other specified abnormal findings of blood chemistry: Secondary | ICD-10-CM | POA: Diagnosis not present

## 2018-03-04 DIAGNOSIS — Z7901 Long term (current) use of anticoagulants: Secondary | ICD-10-CM | POA: Diagnosis not present

## 2018-03-04 DIAGNOSIS — I480 Paroxysmal atrial fibrillation: Secondary | ICD-10-CM | POA: Diagnosis not present

## 2018-03-04 DIAGNOSIS — E785 Hyperlipidemia, unspecified: Secondary | ICD-10-CM | POA: Diagnosis not present

## 2018-03-04 DIAGNOSIS — I517 Cardiomegaly: Secondary | ICD-10-CM | POA: Diagnosis not present

## 2018-03-04 DIAGNOSIS — Z87891 Personal history of nicotine dependence: Secondary | ICD-10-CM | POA: Diagnosis not present

## 2018-03-04 DIAGNOSIS — I5023 Acute on chronic systolic (congestive) heart failure: Secondary | ICD-10-CM | POA: Diagnosis not present

## 2018-03-05 DIAGNOSIS — I5023 Acute on chronic systolic (congestive) heart failure: Secondary | ICD-10-CM | POA: Diagnosis not present

## 2018-03-06 DIAGNOSIS — I5023 Acute on chronic systolic (congestive) heart failure: Secondary | ICD-10-CM | POA: Diagnosis not present

## 2018-03-11 DIAGNOSIS — E876 Hypokalemia: Secondary | ICD-10-CM | POA: Diagnosis not present

## 2018-03-11 DIAGNOSIS — I509 Heart failure, unspecified: Secondary | ICD-10-CM | POA: Diagnosis not present

## 2018-03-11 DIAGNOSIS — D649 Anemia, unspecified: Secondary | ICD-10-CM | POA: Diagnosis not present

## 2018-03-11 DIAGNOSIS — I255 Ischemic cardiomyopathy: Secondary | ICD-10-CM | POA: Diagnosis not present

## 2018-05-04 DIAGNOSIS — I472 Ventricular tachycardia: Secondary | ICD-10-CM | POA: Diagnosis not present

## 2018-06-15 DIAGNOSIS — E785 Hyperlipidemia, unspecified: Secondary | ICD-10-CM | POA: Diagnosis not present

## 2018-06-15 DIAGNOSIS — Z87891 Personal history of nicotine dependence: Secondary | ICD-10-CM | POA: Diagnosis not present

## 2018-06-15 DIAGNOSIS — I252 Old myocardial infarction: Secondary | ICD-10-CM | POA: Diagnosis not present

## 2018-06-15 DIAGNOSIS — I110 Hypertensive heart disease with heart failure: Secondary | ICD-10-CM | POA: Diagnosis not present

## 2018-06-15 DIAGNOSIS — I5022 Chronic systolic (congestive) heart failure: Secondary | ICD-10-CM | POA: Diagnosis not present

## 2018-06-15 DIAGNOSIS — Z95810 Presence of automatic (implantable) cardiac defibrillator: Secondary | ICD-10-CM | POA: Diagnosis not present

## 2018-06-15 DIAGNOSIS — R0989 Other specified symptoms and signs involving the circulatory and respiratory systems: Secondary | ICD-10-CM | POA: Diagnosis not present

## 2018-06-15 DIAGNOSIS — I4892 Unspecified atrial flutter: Secondary | ICD-10-CM | POA: Diagnosis not present

## 2018-06-15 DIAGNOSIS — I255 Ischemic cardiomyopathy: Secondary | ICD-10-CM | POA: Diagnosis not present

## 2018-06-15 DIAGNOSIS — I214 Non-ST elevation (NSTEMI) myocardial infarction: Secondary | ICD-10-CM | POA: Diagnosis not present

## 2018-06-15 DIAGNOSIS — K219 Gastro-esophageal reflux disease without esophagitis: Secondary | ICD-10-CM | POA: Diagnosis not present

## 2018-06-15 DIAGNOSIS — I272 Pulmonary hypertension, unspecified: Secondary | ICD-10-CM | POA: Diagnosis not present

## 2018-06-15 DIAGNOSIS — I25118 Atherosclerotic heart disease of native coronary artery with other forms of angina pectoris: Secondary | ICD-10-CM | POA: Diagnosis not present

## 2018-06-15 DIAGNOSIS — I499 Cardiac arrhythmia, unspecified: Secondary | ICD-10-CM | POA: Diagnosis not present

## 2018-06-15 DIAGNOSIS — I4821 Permanent atrial fibrillation: Secondary | ICD-10-CM | POA: Diagnosis not present

## 2018-06-15 DIAGNOSIS — R0602 Shortness of breath: Secondary | ICD-10-CM | POA: Diagnosis not present

## 2018-06-15 DIAGNOSIS — R079 Chest pain, unspecified: Secondary | ICD-10-CM | POA: Diagnosis not present

## 2018-06-16 DIAGNOSIS — I4821 Permanent atrial fibrillation: Secondary | ICD-10-CM | POA: Diagnosis not present

## 2018-06-16 DIAGNOSIS — I252 Old myocardial infarction: Secondary | ICD-10-CM | POA: Diagnosis not present

## 2018-06-16 DIAGNOSIS — I5022 Chronic systolic (congestive) heart failure: Secondary | ICD-10-CM | POA: Diagnosis not present

## 2018-06-16 DIAGNOSIS — I25118 Atherosclerotic heart disease of native coronary artery with other forms of angina pectoris: Secondary | ICD-10-CM | POA: Diagnosis not present

## 2018-06-16 DIAGNOSIS — K219 Gastro-esophageal reflux disease without esophagitis: Secondary | ICD-10-CM | POA: Diagnosis not present

## 2018-06-16 DIAGNOSIS — Z87891 Personal history of nicotine dependence: Secondary | ICD-10-CM | POA: Diagnosis not present

## 2018-06-16 DIAGNOSIS — I255 Ischemic cardiomyopathy: Secondary | ICD-10-CM | POA: Diagnosis not present

## 2018-06-16 DIAGNOSIS — I214 Non-ST elevation (NSTEMI) myocardial infarction: Secondary | ICD-10-CM | POA: Diagnosis not present

## 2018-06-16 DIAGNOSIS — Z95810 Presence of automatic (implantable) cardiac defibrillator: Secondary | ICD-10-CM | POA: Diagnosis not present

## 2018-06-16 DIAGNOSIS — I272 Pulmonary hypertension, unspecified: Secondary | ICD-10-CM | POA: Diagnosis not present

## 2018-06-16 DIAGNOSIS — I4892 Unspecified atrial flutter: Secondary | ICD-10-CM | POA: Diagnosis not present

## 2018-06-22 DIAGNOSIS — I4891 Unspecified atrial fibrillation: Secondary | ICD-10-CM | POA: Diagnosis not present

## 2018-06-22 DIAGNOSIS — R079 Chest pain, unspecified: Secondary | ICD-10-CM | POA: Diagnosis not present

## 2018-06-22 DIAGNOSIS — I2510 Atherosclerotic heart disease of native coronary artery without angina pectoris: Secondary | ICD-10-CM | POA: Diagnosis not present

## 2018-06-22 DIAGNOSIS — I509 Heart failure, unspecified: Secondary | ICD-10-CM | POA: Diagnosis not present

## 2018-06-22 DIAGNOSIS — M25531 Pain in right wrist: Secondary | ICD-10-CM | POA: Diagnosis not present

## 2018-06-22 DIAGNOSIS — I10 Essential (primary) hypertension: Secondary | ICD-10-CM | POA: Diagnosis not present

## 2018-06-22 DIAGNOSIS — R0602 Shortness of breath: Secondary | ICD-10-CM | POA: Diagnosis not present

## 2018-06-22 DIAGNOSIS — D649 Anemia, unspecified: Secondary | ICD-10-CM | POA: Diagnosis not present

## 2018-06-25 DIAGNOSIS — R079 Chest pain, unspecified: Secondary | ICD-10-CM | POA: Diagnosis not present

## 2018-06-25 DIAGNOSIS — R0602 Shortness of breath: Secondary | ICD-10-CM | POA: Diagnosis not present

## 2018-06-25 DIAGNOSIS — I493 Ventricular premature depolarization: Secondary | ICD-10-CM | POA: Diagnosis not present

## 2018-06-25 DIAGNOSIS — I5020 Unspecified systolic (congestive) heart failure: Secondary | ICD-10-CM | POA: Diagnosis not present

## 2018-06-25 DIAGNOSIS — I4892 Unspecified atrial flutter: Secondary | ICD-10-CM | POA: Diagnosis not present

## 2018-06-25 DIAGNOSIS — Z95810 Presence of automatic (implantable) cardiac defibrillator: Secondary | ICD-10-CM | POA: Diagnosis not present

## 2018-06-25 DIAGNOSIS — I2510 Atherosclerotic heart disease of native coronary artery without angina pectoris: Secondary | ICD-10-CM | POA: Diagnosis not present

## 2018-06-25 DIAGNOSIS — R0902 Hypoxemia: Secondary | ICD-10-CM | POA: Diagnosis not present

## 2018-06-25 DIAGNOSIS — I214 Non-ST elevation (NSTEMI) myocardial infarction: Secondary | ICD-10-CM | POA: Diagnosis not present

## 2018-06-25 DIAGNOSIS — I4891 Unspecified atrial fibrillation: Secondary | ICD-10-CM | POA: Diagnosis not present

## 2018-06-25 DIAGNOSIS — R7989 Other specified abnormal findings of blood chemistry: Secondary | ICD-10-CM | POA: Diagnosis not present

## 2018-06-25 DIAGNOSIS — J189 Pneumonia, unspecified organism: Secondary | ICD-10-CM | POA: Diagnosis not present

## 2018-06-25 DIAGNOSIS — I252 Old myocardial infarction: Secondary | ICD-10-CM | POA: Diagnosis not present

## 2018-06-26 DIAGNOSIS — I517 Cardiomegaly: Secondary | ICD-10-CM | POA: Diagnosis not present

## 2018-06-26 DIAGNOSIS — Z7901 Long term (current) use of anticoagulants: Secondary | ICD-10-CM | POA: Diagnosis not present

## 2018-06-26 DIAGNOSIS — I4891 Unspecified atrial fibrillation: Secondary | ICD-10-CM | POA: Diagnosis not present

## 2018-06-26 DIAGNOSIS — I493 Ventricular premature depolarization: Secondary | ICD-10-CM | POA: Diagnosis not present

## 2018-06-26 DIAGNOSIS — I4892 Unspecified atrial flutter: Secondary | ICD-10-CM | POA: Diagnosis not present

## 2018-06-26 DIAGNOSIS — Z95810 Presence of automatic (implantable) cardiac defibrillator: Secondary | ICD-10-CM | POA: Diagnosis not present

## 2018-06-26 DIAGNOSIS — I2510 Atherosclerotic heart disease of native coronary artery without angina pectoris: Secondary | ICD-10-CM | POA: Diagnosis not present

## 2018-06-26 DIAGNOSIS — I509 Heart failure, unspecified: Secondary | ICD-10-CM | POA: Diagnosis not present

## 2018-06-26 DIAGNOSIS — J189 Pneumonia, unspecified organism: Secondary | ICD-10-CM | POA: Diagnosis not present

## 2018-06-26 DIAGNOSIS — R9431 Abnormal electrocardiogram [ECG] [EKG]: Secondary | ICD-10-CM | POA: Diagnosis not present

## 2018-06-27 DIAGNOSIS — I420 Dilated cardiomyopathy: Secondary | ICD-10-CM | POA: Diagnosis not present

## 2018-06-27 DIAGNOSIS — I4821 Permanent atrial fibrillation: Secondary | ICD-10-CM | POA: Diagnosis not present

## 2018-06-27 DIAGNOSIS — I5022 Chronic systolic (congestive) heart failure: Secondary | ICD-10-CM | POA: Diagnosis not present

## 2018-06-27 DIAGNOSIS — I255 Ischemic cardiomyopathy: Secondary | ICD-10-CM | POA: Diagnosis not present

## 2018-07-22 DIAGNOSIS — D649 Anemia, unspecified: Secondary | ICD-10-CM | POA: Diagnosis not present

## 2018-07-22 DIAGNOSIS — I2510 Atherosclerotic heart disease of native coronary artery without angina pectoris: Secondary | ICD-10-CM | POA: Diagnosis not present

## 2018-07-22 DIAGNOSIS — I714 Abdominal aortic aneurysm, without rupture: Secondary | ICD-10-CM | POA: Diagnosis not present

## 2018-07-22 DIAGNOSIS — Z8739 Personal history of other diseases of the musculoskeletal system and connective tissue: Secondary | ICD-10-CM | POA: Diagnosis not present

## 2018-07-22 DIAGNOSIS — I255 Ischemic cardiomyopathy: Secondary | ICD-10-CM | POA: Diagnosis not present

## 2018-08-03 DIAGNOSIS — I5022 Chronic systolic (congestive) heart failure: Secondary | ICD-10-CM | POA: Diagnosis not present

## 2018-08-03 DIAGNOSIS — I255 Ischemic cardiomyopathy: Secondary | ICD-10-CM | POA: Diagnosis not present

## 2018-08-03 DIAGNOSIS — I420 Dilated cardiomyopathy: Secondary | ICD-10-CM | POA: Diagnosis not present

## 2018-08-03 DIAGNOSIS — I4821 Permanent atrial fibrillation: Secondary | ICD-10-CM | POA: Diagnosis not present

## 2018-08-03 DIAGNOSIS — I472 Ventricular tachycardia: Secondary | ICD-10-CM | POA: Diagnosis not present

## 2018-08-18 DIAGNOSIS — I255 Ischemic cardiomyopathy: Secondary | ICD-10-CM | POA: Diagnosis not present

## 2018-08-18 DIAGNOSIS — F329 Major depressive disorder, single episode, unspecified: Secondary | ICD-10-CM | POA: Diagnosis not present

## 2018-08-18 DIAGNOSIS — I4891 Unspecified atrial fibrillation: Secondary | ICD-10-CM | POA: Diagnosis not present

## 2018-08-18 DIAGNOSIS — I2510 Atherosclerotic heart disease of native coronary artery without angina pectoris: Secondary | ICD-10-CM | POA: Diagnosis not present

## 2018-08-18 DIAGNOSIS — I509 Heart failure, unspecified: Secondary | ICD-10-CM | POA: Diagnosis not present

## 2018-08-18 DIAGNOSIS — E785 Hyperlipidemia, unspecified: Secondary | ICD-10-CM | POA: Diagnosis not present

## 2018-08-18 DIAGNOSIS — I10 Essential (primary) hypertension: Secondary | ICD-10-CM | POA: Diagnosis not present

## 2018-08-21 IMAGING — US US AORTA
1 series · 8 of 8 positions shown · non-contrast
Comparison: CT scan of August 21, 2015.

CLINICAL DATA: Abdominal aortic aneurysm.

EXAM:
ULTRASOUND OF ABDOMINAL AORTA
TECHNIQUE: Ultrasound examination of the abdominal aorta was performed to
evaluate for abdominal aortic aneurysm.

[Series 1: us aorta · 0.28mm/px · 8 of 8 slices shown]
[im 1/8]
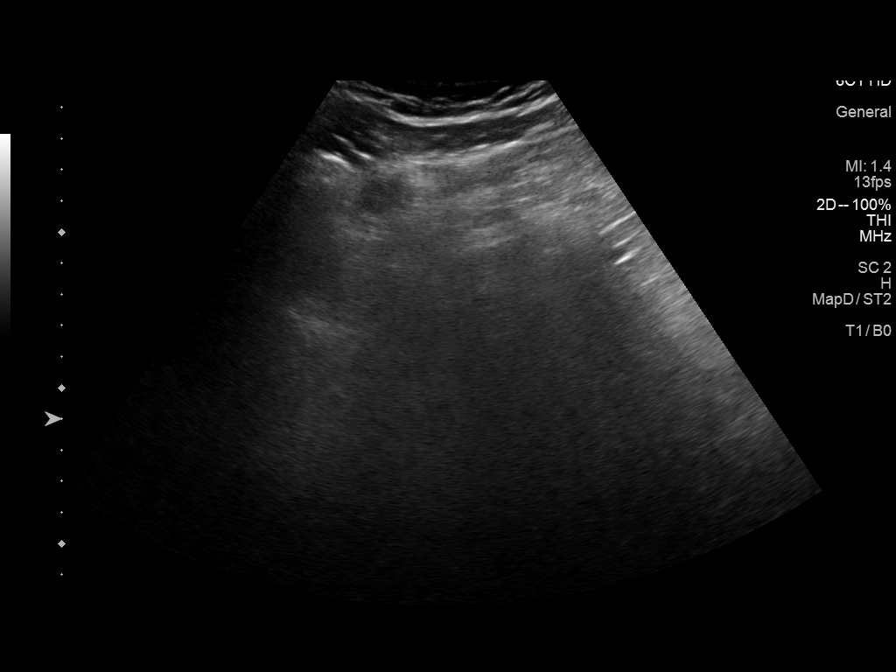
[im 2/8]
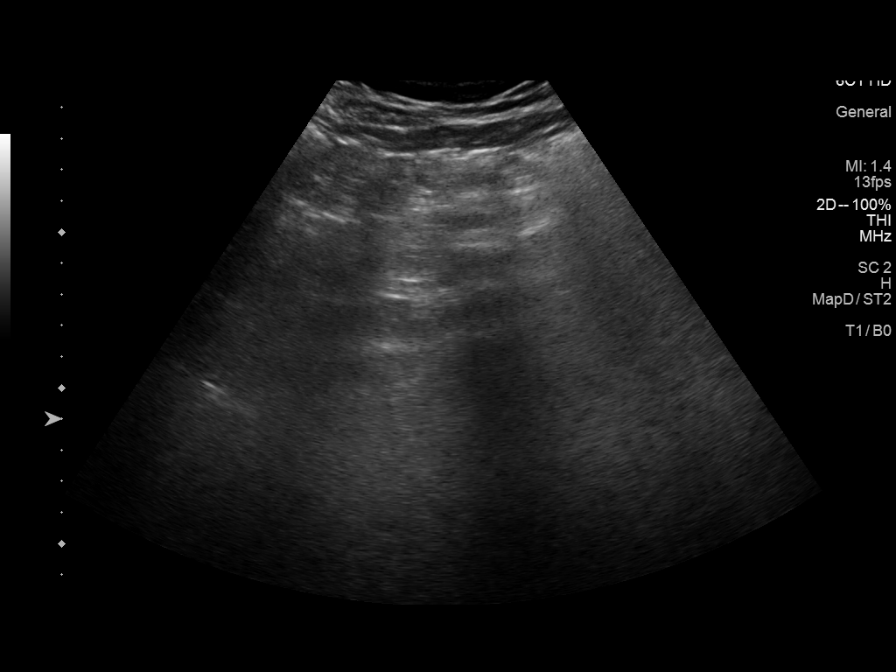
[im 3/8]
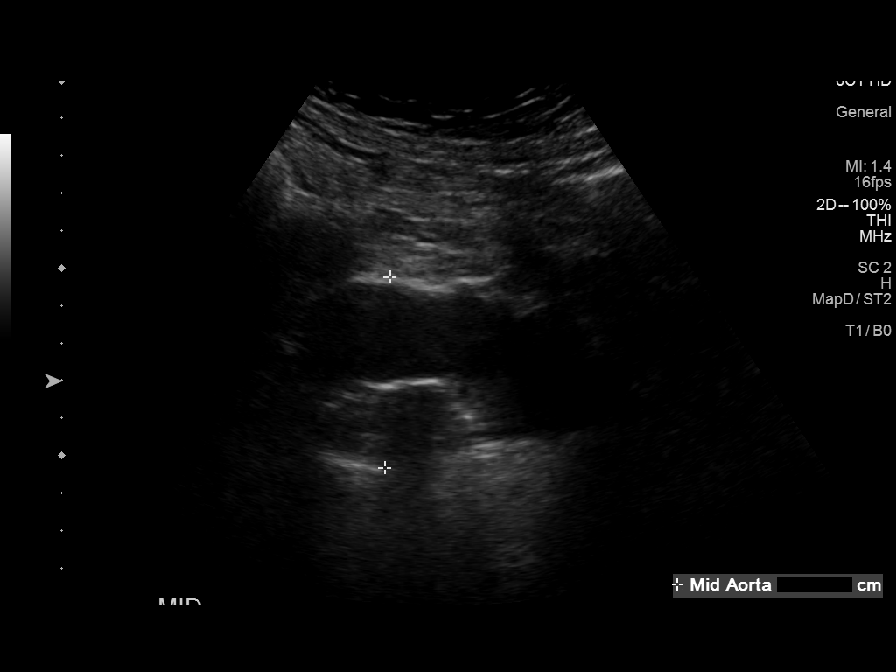
[im 4/8]
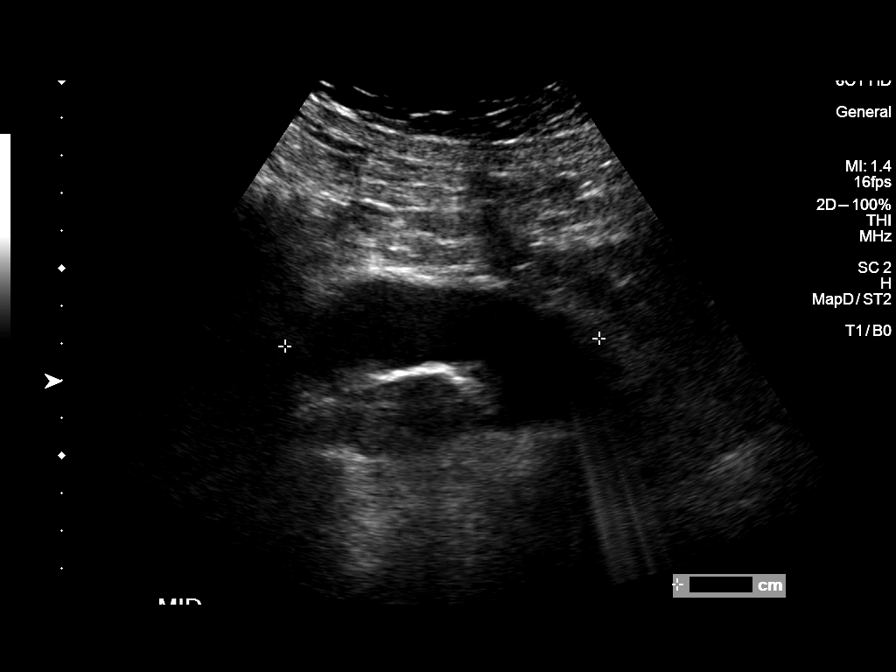
[im 5/8]
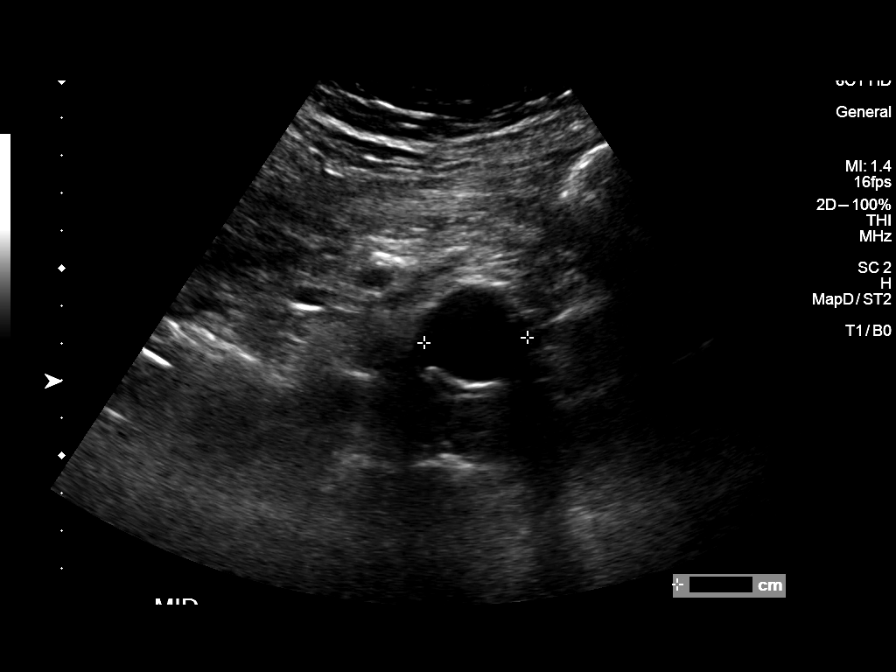
[im 6/8]
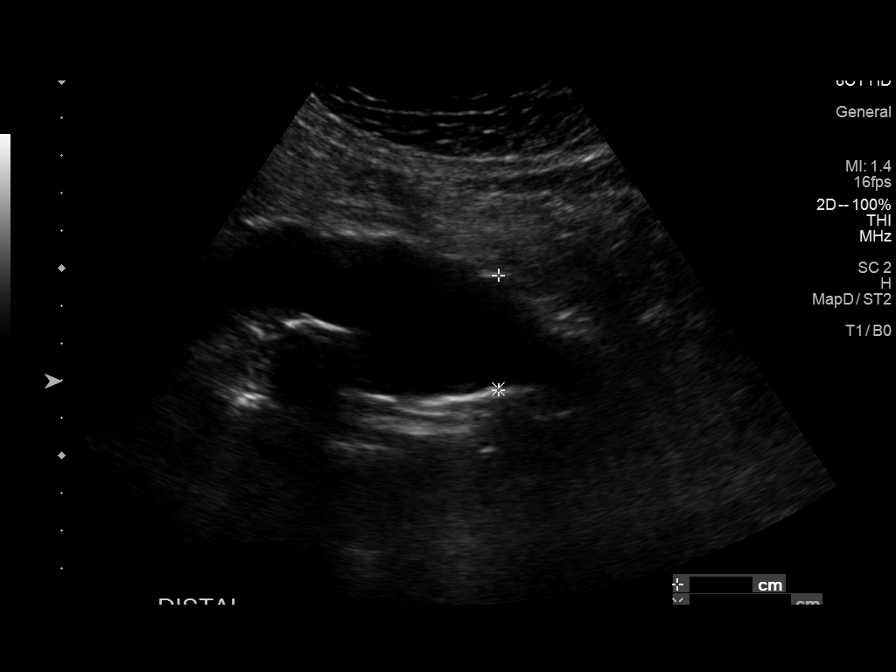
[im 7/8]
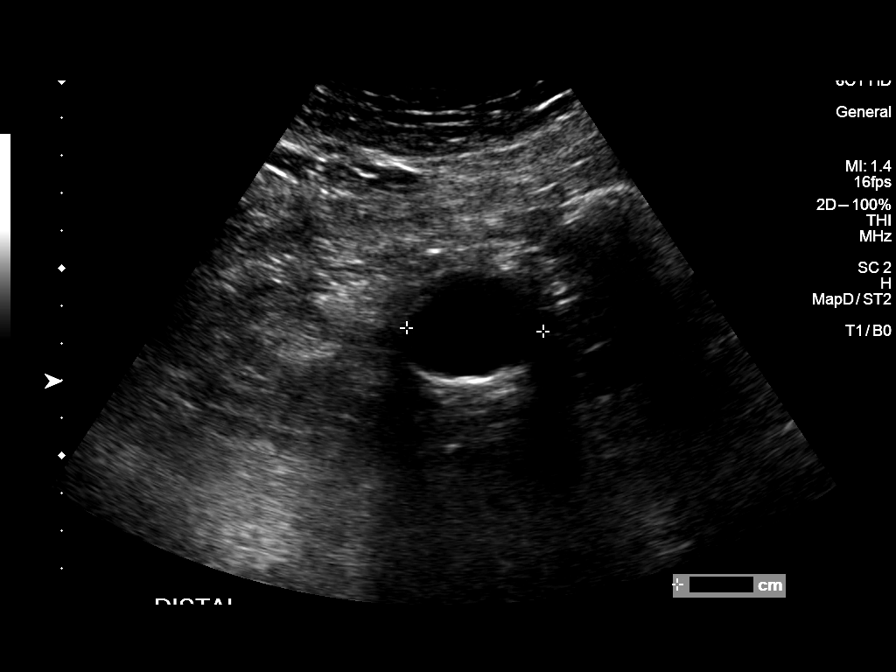
[im 8/8]
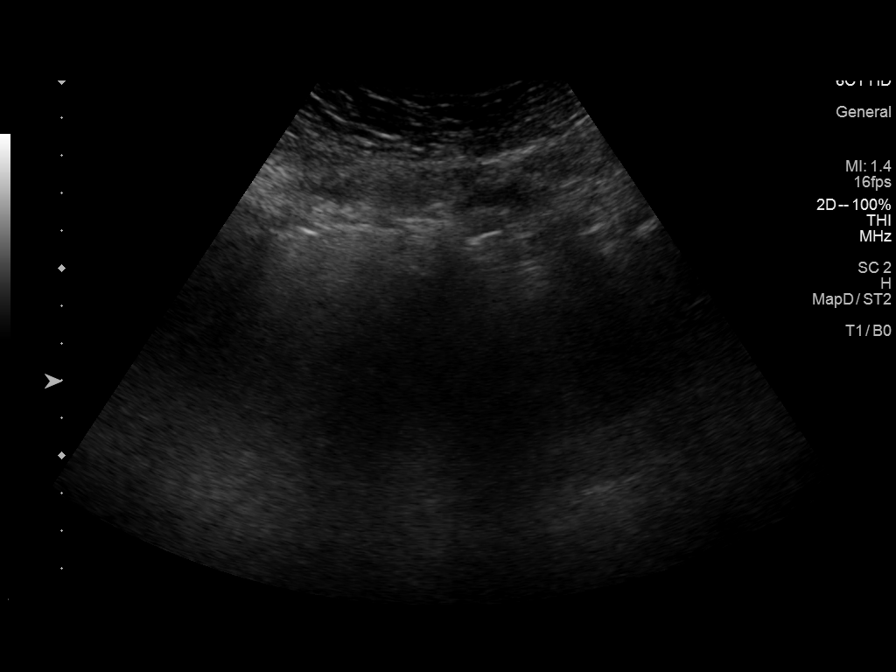

[8 of 8 positions shown; findings below may reference images not displayed]

FINDINGS: Abdominal aortic measurements as follows:

Proximal:  Not visualized due to overlying bowel gas.

Mid: 3.1 cm ; I feel the technologist's measurement on image number
4 extends beyond the posterior margin of the aneurysm own compared
to prior CT scan, and therefore overestimates the size of the
aneurysm. I measures 3.1 cm.

Distal:  4.2 cm by my own measurements.
IMPRESSION: Stable 4.2 cm infrarenal abdominal aortic aneurysm. Recommend
followup by ultrasound in 1 year. This recommendation follows ACR
consensus guidelines: White Paper of the ACR Incidental Findings
Committee II on Vascular Findings. [HOSPITAL] [id number];

## 2018-10-25 DIAGNOSIS — I4891 Unspecified atrial fibrillation: Secondary | ICD-10-CM | POA: Diagnosis not present

## 2018-10-25 DIAGNOSIS — E785 Hyperlipidemia, unspecified: Secondary | ICD-10-CM | POA: Diagnosis not present

## 2018-10-25 DIAGNOSIS — I2510 Atherosclerotic heart disease of native coronary artery without angina pectoris: Secondary | ICD-10-CM | POA: Diagnosis not present

## 2018-10-25 DIAGNOSIS — R011 Cardiac murmur, unspecified: Secondary | ICD-10-CM | POA: Diagnosis not present

## 2018-10-25 DIAGNOSIS — R0689 Other abnormalities of breathing: Secondary | ICD-10-CM | POA: Diagnosis not present

## 2018-10-25 DIAGNOSIS — R0902 Hypoxemia: Secondary | ICD-10-CM | POA: Diagnosis not present

## 2018-10-25 DIAGNOSIS — Z7901 Long term (current) use of anticoagulants: Secondary | ICD-10-CM | POA: Diagnosis not present

## 2018-10-25 DIAGNOSIS — Z20828 Contact with and (suspected) exposure to other viral communicable diseases: Secondary | ICD-10-CM | POA: Diagnosis not present

## 2018-10-25 DIAGNOSIS — R0602 Shortness of breath: Secondary | ICD-10-CM | POA: Diagnosis not present

## 2018-10-25 DIAGNOSIS — K219 Gastro-esophageal reflux disease without esophagitis: Secondary | ICD-10-CM | POA: Diagnosis not present

## 2018-10-25 DIAGNOSIS — I110 Hypertensive heart disease with heart failure: Secondary | ICD-10-CM | POA: Diagnosis not present

## 2018-10-25 DIAGNOSIS — I5023 Acute on chronic systolic (congestive) heart failure: Secondary | ICD-10-CM | POA: Diagnosis not present

## 2018-10-25 DIAGNOSIS — E8770 Fluid overload, unspecified: Secondary | ICD-10-CM | POA: Diagnosis not present

## 2018-10-25 DIAGNOSIS — I255 Ischemic cardiomyopathy: Secondary | ICD-10-CM | POA: Diagnosis not present

## 2018-10-25 DIAGNOSIS — I517 Cardiomegaly: Secondary | ICD-10-CM | POA: Diagnosis not present

## 2018-10-25 DIAGNOSIS — I252 Old myocardial infarction: Secondary | ICD-10-CM | POA: Diagnosis not present

## 2018-10-25 DIAGNOSIS — I5022 Chronic systolic (congestive) heart failure: Secondary | ICD-10-CM | POA: Diagnosis not present

## 2018-10-25 DIAGNOSIS — I480 Paroxysmal atrial fibrillation: Secondary | ICD-10-CM | POA: Diagnosis not present

## 2018-10-25 DIAGNOSIS — J90 Pleural effusion, not elsewhere classified: Secondary | ICD-10-CM | POA: Diagnosis not present

## 2018-10-25 DIAGNOSIS — Z209 Contact with and (suspected) exposure to unspecified communicable disease: Secondary | ICD-10-CM | POA: Diagnosis not present

## 2018-10-25 DIAGNOSIS — R2689 Other abnormalities of gait and mobility: Secondary | ICD-10-CM | POA: Diagnosis not present

## 2018-10-26 DIAGNOSIS — I5023 Acute on chronic systolic (congestive) heart failure: Secondary | ICD-10-CM | POA: Diagnosis not present

## 2018-10-27 DIAGNOSIS — I5023 Acute on chronic systolic (congestive) heart failure: Secondary | ICD-10-CM | POA: Diagnosis not present

## 2018-10-27 DIAGNOSIS — I517 Cardiomegaly: Secondary | ICD-10-CM | POA: Diagnosis not present

## 2018-10-27 DIAGNOSIS — I519 Heart disease, unspecified: Secondary | ICD-10-CM | POA: Diagnosis not present

## 2018-10-27 DIAGNOSIS — I878 Other specified disorders of veins: Secondary | ICD-10-CM | POA: Diagnosis not present

## 2018-10-27 DIAGNOSIS — I081 Rheumatic disorders of both mitral and tricuspid valves: Secondary | ICD-10-CM | POA: Diagnosis not present

## 2018-10-28 DIAGNOSIS — I5023 Acute on chronic systolic (congestive) heart failure: Secondary | ICD-10-CM | POA: Diagnosis not present

## 2018-10-30 DIAGNOSIS — I252 Old myocardial infarction: Secondary | ICD-10-CM | POA: Diagnosis not present

## 2018-10-30 DIAGNOSIS — I110 Hypertensive heart disease with heart failure: Secondary | ICD-10-CM | POA: Diagnosis not present

## 2018-10-30 DIAGNOSIS — I0981 Rheumatic heart failure: Secondary | ICD-10-CM | POA: Diagnosis not present

## 2018-10-30 DIAGNOSIS — I428 Other cardiomyopathies: Secondary | ICD-10-CM | POA: Diagnosis not present

## 2018-10-30 DIAGNOSIS — I272 Pulmonary hypertension, unspecified: Secondary | ICD-10-CM | POA: Diagnosis not present

## 2018-10-30 DIAGNOSIS — I480 Paroxysmal atrial fibrillation: Secondary | ICD-10-CM | POA: Diagnosis not present

## 2018-10-30 DIAGNOSIS — I2510 Atherosclerotic heart disease of native coronary artery without angina pectoris: Secondary | ICD-10-CM | POA: Diagnosis not present

## 2018-10-30 DIAGNOSIS — I081 Rheumatic disorders of both mitral and tricuspid valves: Secondary | ICD-10-CM | POA: Diagnosis not present

## 2018-10-30 DIAGNOSIS — I5023 Acute on chronic systolic (congestive) heart failure: Secondary | ICD-10-CM | POA: Diagnosis not present

## 2018-11-02 DIAGNOSIS — D649 Anemia, unspecified: Secondary | ICD-10-CM | POA: Diagnosis not present

## 2018-11-02 DIAGNOSIS — I272 Pulmonary hypertension, unspecified: Secondary | ICD-10-CM | POA: Diagnosis not present

## 2018-11-02 DIAGNOSIS — I5023 Acute on chronic systolic (congestive) heart failure: Secondary | ICD-10-CM | POA: Diagnosis not present

## 2018-11-02 DIAGNOSIS — I252 Old myocardial infarction: Secondary | ICD-10-CM | POA: Diagnosis not present

## 2018-11-02 DIAGNOSIS — I2510 Atherosclerotic heart disease of native coronary artery without angina pectoris: Secondary | ICD-10-CM | POA: Diagnosis not present

## 2018-11-02 DIAGNOSIS — I428 Other cardiomyopathies: Secondary | ICD-10-CM | POA: Diagnosis not present

## 2018-11-02 DIAGNOSIS — I480 Paroxysmal atrial fibrillation: Secondary | ICD-10-CM | POA: Diagnosis not present

## 2018-11-02 DIAGNOSIS — I0981 Rheumatic heart failure: Secondary | ICD-10-CM | POA: Diagnosis not present

## 2018-11-02 DIAGNOSIS — I509 Heart failure, unspecified: Secondary | ICD-10-CM | POA: Diagnosis not present

## 2018-11-02 DIAGNOSIS — I10 Essential (primary) hypertension: Secondary | ICD-10-CM | POA: Diagnosis not present

## 2018-11-02 DIAGNOSIS — I110 Hypertensive heart disease with heart failure: Secondary | ICD-10-CM | POA: Diagnosis not present

## 2018-11-02 DIAGNOSIS — I081 Rheumatic disorders of both mitral and tricuspid valves: Secondary | ICD-10-CM | POA: Diagnosis not present

## 2018-11-04 DIAGNOSIS — I252 Old myocardial infarction: Secondary | ICD-10-CM | POA: Diagnosis not present

## 2018-11-04 DIAGNOSIS — I081 Rheumatic disorders of both mitral and tricuspid valves: Secondary | ICD-10-CM | POA: Diagnosis not present

## 2018-11-04 DIAGNOSIS — I428 Other cardiomyopathies: Secondary | ICD-10-CM | POA: Diagnosis not present

## 2018-11-04 DIAGNOSIS — I2510 Atherosclerotic heart disease of native coronary artery without angina pectoris: Secondary | ICD-10-CM | POA: Diagnosis not present

## 2018-11-04 DIAGNOSIS — I272 Pulmonary hypertension, unspecified: Secondary | ICD-10-CM | POA: Diagnosis not present

## 2018-11-04 DIAGNOSIS — I5023 Acute on chronic systolic (congestive) heart failure: Secondary | ICD-10-CM | POA: Diagnosis not present

## 2018-11-04 DIAGNOSIS — I110 Hypertensive heart disease with heart failure: Secondary | ICD-10-CM | POA: Diagnosis not present

## 2018-11-04 DIAGNOSIS — I480 Paroxysmal atrial fibrillation: Secondary | ICD-10-CM | POA: Diagnosis not present

## 2018-11-04 DIAGNOSIS — I0981 Rheumatic heart failure: Secondary | ICD-10-CM | POA: Diagnosis not present

## 2018-11-07 DIAGNOSIS — I5023 Acute on chronic systolic (congestive) heart failure: Secondary | ICD-10-CM | POA: Diagnosis not present

## 2018-11-07 DIAGNOSIS — I0981 Rheumatic heart failure: Secondary | ICD-10-CM | POA: Diagnosis not present

## 2018-11-07 DIAGNOSIS — I252 Old myocardial infarction: Secondary | ICD-10-CM | POA: Diagnosis not present

## 2018-11-07 DIAGNOSIS — I110 Hypertensive heart disease with heart failure: Secondary | ICD-10-CM | POA: Diagnosis not present

## 2018-11-07 DIAGNOSIS — I2510 Atherosclerotic heart disease of native coronary artery without angina pectoris: Secondary | ICD-10-CM | POA: Diagnosis not present

## 2018-11-07 DIAGNOSIS — I428 Other cardiomyopathies: Secondary | ICD-10-CM | POA: Diagnosis not present

## 2018-11-07 DIAGNOSIS — I272 Pulmonary hypertension, unspecified: Secondary | ICD-10-CM | POA: Diagnosis not present

## 2018-11-07 DIAGNOSIS — I081 Rheumatic disorders of both mitral and tricuspid valves: Secondary | ICD-10-CM | POA: Diagnosis not present

## 2018-11-07 DIAGNOSIS — I480 Paroxysmal atrial fibrillation: Secondary | ICD-10-CM | POA: Diagnosis not present

## 2018-11-08 DIAGNOSIS — I5023 Acute on chronic systolic (congestive) heart failure: Secondary | ICD-10-CM | POA: Diagnosis not present

## 2018-11-08 DIAGNOSIS — I272 Pulmonary hypertension, unspecified: Secondary | ICD-10-CM | POA: Diagnosis not present

## 2018-11-08 DIAGNOSIS — I252 Old myocardial infarction: Secondary | ICD-10-CM | POA: Diagnosis not present

## 2018-11-08 DIAGNOSIS — I2510 Atherosclerotic heart disease of native coronary artery without angina pectoris: Secondary | ICD-10-CM | POA: Diagnosis not present

## 2018-11-08 DIAGNOSIS — I428 Other cardiomyopathies: Secondary | ICD-10-CM | POA: Diagnosis not present

## 2018-11-08 DIAGNOSIS — I110 Hypertensive heart disease with heart failure: Secondary | ICD-10-CM | POA: Diagnosis not present

## 2018-11-08 DIAGNOSIS — I480 Paroxysmal atrial fibrillation: Secondary | ICD-10-CM | POA: Diagnosis not present

## 2018-11-08 DIAGNOSIS — I0981 Rheumatic heart failure: Secondary | ICD-10-CM | POA: Diagnosis not present

## 2018-11-08 DIAGNOSIS — I081 Rheumatic disorders of both mitral and tricuspid valves: Secondary | ICD-10-CM | POA: Diagnosis not present

## 2018-11-09 DIAGNOSIS — I472 Ventricular tachycardia: Secondary | ICD-10-CM | POA: Diagnosis not present

## 2018-11-10 DIAGNOSIS — I272 Pulmonary hypertension, unspecified: Secondary | ICD-10-CM | POA: Diagnosis not present

## 2018-11-10 DIAGNOSIS — I480 Paroxysmal atrial fibrillation: Secondary | ICD-10-CM | POA: Diagnosis not present

## 2018-11-10 DIAGNOSIS — I0981 Rheumatic heart failure: Secondary | ICD-10-CM | POA: Diagnosis not present

## 2018-11-10 DIAGNOSIS — I252 Old myocardial infarction: Secondary | ICD-10-CM | POA: Diagnosis not present

## 2018-11-10 DIAGNOSIS — I2510 Atherosclerotic heart disease of native coronary artery without angina pectoris: Secondary | ICD-10-CM | POA: Diagnosis not present

## 2018-11-10 DIAGNOSIS — I081 Rheumatic disorders of both mitral and tricuspid valves: Secondary | ICD-10-CM | POA: Diagnosis not present

## 2018-11-10 DIAGNOSIS — I428 Other cardiomyopathies: Secondary | ICD-10-CM | POA: Diagnosis not present

## 2018-11-10 DIAGNOSIS — I110 Hypertensive heart disease with heart failure: Secondary | ICD-10-CM | POA: Diagnosis not present

## 2018-11-10 DIAGNOSIS — I5023 Acute on chronic systolic (congestive) heart failure: Secondary | ICD-10-CM | POA: Diagnosis not present

## 2018-11-15 DIAGNOSIS — I472 Ventricular tachycardia: Secondary | ICD-10-CM | POA: Diagnosis not present

## 2018-11-15 DIAGNOSIS — I480 Paroxysmal atrial fibrillation: Secondary | ICD-10-CM | POA: Diagnosis not present

## 2018-11-15 DIAGNOSIS — I484 Atypical atrial flutter: Secondary | ICD-10-CM | POA: Diagnosis not present

## 2018-11-15 DIAGNOSIS — I428 Other cardiomyopathies: Secondary | ICD-10-CM | POA: Diagnosis not present

## 2018-11-15 DIAGNOSIS — I255 Ischemic cardiomyopathy: Secondary | ICD-10-CM | POA: Diagnosis not present

## 2018-11-15 DIAGNOSIS — I2510 Atherosclerotic heart disease of native coronary artery without angina pectoris: Secondary | ICD-10-CM | POA: Diagnosis not present

## 2018-11-15 DIAGNOSIS — I4819 Other persistent atrial fibrillation: Secondary | ICD-10-CM | POA: Diagnosis not present

## 2018-11-15 DIAGNOSIS — I081 Rheumatic disorders of both mitral and tricuspid valves: Secondary | ICD-10-CM | POA: Diagnosis not present

## 2018-11-15 DIAGNOSIS — I110 Hypertensive heart disease with heart failure: Secondary | ICD-10-CM | POA: Diagnosis not present

## 2018-11-15 DIAGNOSIS — I272 Pulmonary hypertension, unspecified: Secondary | ICD-10-CM | POA: Diagnosis not present

## 2018-11-15 DIAGNOSIS — I252 Old myocardial infarction: Secondary | ICD-10-CM | POA: Diagnosis not present

## 2018-11-15 DIAGNOSIS — I5023 Acute on chronic systolic (congestive) heart failure: Secondary | ICD-10-CM | POA: Diagnosis not present

## 2018-11-15 DIAGNOSIS — I0981 Rheumatic heart failure: Secondary | ICD-10-CM | POA: Diagnosis not present

## 2018-11-15 DIAGNOSIS — Z95810 Presence of automatic (implantable) cardiac defibrillator: Secondary | ICD-10-CM | POA: Diagnosis not present

## 2018-11-16 DIAGNOSIS — I252 Old myocardial infarction: Secondary | ICD-10-CM | POA: Diagnosis not present

## 2018-11-16 DIAGNOSIS — I5023 Acute on chronic systolic (congestive) heart failure: Secondary | ICD-10-CM | POA: Diagnosis not present

## 2018-11-16 DIAGNOSIS — I2510 Atherosclerotic heart disease of native coronary artery without angina pectoris: Secondary | ICD-10-CM | POA: Diagnosis not present

## 2018-11-16 DIAGNOSIS — I272 Pulmonary hypertension, unspecified: Secondary | ICD-10-CM | POA: Diagnosis not present

## 2018-11-16 DIAGNOSIS — I0981 Rheumatic heart failure: Secondary | ICD-10-CM | POA: Diagnosis not present

## 2018-11-16 DIAGNOSIS — I428 Other cardiomyopathies: Secondary | ICD-10-CM | POA: Diagnosis not present

## 2018-11-16 DIAGNOSIS — I110 Hypertensive heart disease with heart failure: Secondary | ICD-10-CM | POA: Diagnosis not present

## 2018-11-16 DIAGNOSIS — I081 Rheumatic disorders of both mitral and tricuspid valves: Secondary | ICD-10-CM | POA: Diagnosis not present

## 2018-11-16 DIAGNOSIS — I480 Paroxysmal atrial fibrillation: Secondary | ICD-10-CM | POA: Diagnosis not present

## 2018-11-18 DIAGNOSIS — I2510 Atherosclerotic heart disease of native coronary artery without angina pectoris: Secondary | ICD-10-CM | POA: Diagnosis not present

## 2018-11-18 DIAGNOSIS — I272 Pulmonary hypertension, unspecified: Secondary | ICD-10-CM | POA: Diagnosis not present

## 2018-11-18 DIAGNOSIS — I480 Paroxysmal atrial fibrillation: Secondary | ICD-10-CM | POA: Diagnosis not present

## 2018-11-18 DIAGNOSIS — I252 Old myocardial infarction: Secondary | ICD-10-CM | POA: Diagnosis not present

## 2018-11-18 DIAGNOSIS — I5023 Acute on chronic systolic (congestive) heart failure: Secondary | ICD-10-CM | POA: Diagnosis not present

## 2018-11-18 DIAGNOSIS — I0981 Rheumatic heart failure: Secondary | ICD-10-CM | POA: Diagnosis not present

## 2018-11-18 DIAGNOSIS — I428 Other cardiomyopathies: Secondary | ICD-10-CM | POA: Diagnosis not present

## 2018-11-18 DIAGNOSIS — I081 Rheumatic disorders of both mitral and tricuspid valves: Secondary | ICD-10-CM | POA: Diagnosis not present

## 2018-11-18 DIAGNOSIS — I110 Hypertensive heart disease with heart failure: Secondary | ICD-10-CM | POA: Diagnosis not present

## 2018-11-21 DIAGNOSIS — I081 Rheumatic disorders of both mitral and tricuspid valves: Secondary | ICD-10-CM | POA: Diagnosis not present

## 2018-11-21 DIAGNOSIS — I2510 Atherosclerotic heart disease of native coronary artery without angina pectoris: Secondary | ICD-10-CM | POA: Diagnosis not present

## 2018-11-21 DIAGNOSIS — I110 Hypertensive heart disease with heart failure: Secondary | ICD-10-CM | POA: Diagnosis not present

## 2018-11-21 DIAGNOSIS — I252 Old myocardial infarction: Secondary | ICD-10-CM | POA: Diagnosis not present

## 2018-11-21 DIAGNOSIS — I5023 Acute on chronic systolic (congestive) heart failure: Secondary | ICD-10-CM | POA: Diagnosis not present

## 2018-11-21 DIAGNOSIS — I480 Paroxysmal atrial fibrillation: Secondary | ICD-10-CM | POA: Diagnosis not present

## 2018-11-21 DIAGNOSIS — I272 Pulmonary hypertension, unspecified: Secondary | ICD-10-CM | POA: Diagnosis not present

## 2018-11-21 DIAGNOSIS — I428 Other cardiomyopathies: Secondary | ICD-10-CM | POA: Diagnosis not present

## 2018-11-21 DIAGNOSIS — I0981 Rheumatic heart failure: Secondary | ICD-10-CM | POA: Diagnosis not present

## 2018-11-23 DIAGNOSIS — I2510 Atherosclerotic heart disease of native coronary artery without angina pectoris: Secondary | ICD-10-CM | POA: Diagnosis not present

## 2018-11-23 DIAGNOSIS — I480 Paroxysmal atrial fibrillation: Secondary | ICD-10-CM | POA: Diagnosis not present

## 2018-11-23 DIAGNOSIS — I110 Hypertensive heart disease with heart failure: Secondary | ICD-10-CM | POA: Diagnosis not present

## 2018-11-23 DIAGNOSIS — I081 Rheumatic disorders of both mitral and tricuspid valves: Secondary | ICD-10-CM | POA: Diagnosis not present

## 2018-11-23 DIAGNOSIS — I5023 Acute on chronic systolic (congestive) heart failure: Secondary | ICD-10-CM | POA: Diagnosis not present

## 2018-11-23 DIAGNOSIS — I0981 Rheumatic heart failure: Secondary | ICD-10-CM | POA: Diagnosis not present

## 2018-11-23 DIAGNOSIS — I428 Other cardiomyopathies: Secondary | ICD-10-CM | POA: Diagnosis not present

## 2018-11-23 DIAGNOSIS — I272 Pulmonary hypertension, unspecified: Secondary | ICD-10-CM | POA: Diagnosis not present

## 2018-11-23 DIAGNOSIS — I252 Old myocardial infarction: Secondary | ICD-10-CM | POA: Diagnosis not present

## 2018-11-28 DIAGNOSIS — I5023 Acute on chronic systolic (congestive) heart failure: Secondary | ICD-10-CM | POA: Diagnosis not present

## 2018-11-28 DIAGNOSIS — I428 Other cardiomyopathies: Secondary | ICD-10-CM | POA: Diagnosis not present

## 2018-11-28 DIAGNOSIS — I110 Hypertensive heart disease with heart failure: Secondary | ICD-10-CM | POA: Diagnosis not present

## 2018-11-28 DIAGNOSIS — I272 Pulmonary hypertension, unspecified: Secondary | ICD-10-CM | POA: Diagnosis not present

## 2018-11-28 DIAGNOSIS — I252 Old myocardial infarction: Secondary | ICD-10-CM | POA: Diagnosis not present

## 2018-11-28 DIAGNOSIS — I2510 Atherosclerotic heart disease of native coronary artery without angina pectoris: Secondary | ICD-10-CM | POA: Diagnosis not present

## 2018-11-28 DIAGNOSIS — I480 Paroxysmal atrial fibrillation: Secondary | ICD-10-CM | POA: Diagnosis not present

## 2018-11-28 DIAGNOSIS — I0981 Rheumatic heart failure: Secondary | ICD-10-CM | POA: Diagnosis not present

## 2018-11-28 DIAGNOSIS — I081 Rheumatic disorders of both mitral and tricuspid valves: Secondary | ICD-10-CM | POA: Diagnosis not present

## 2018-11-30 DIAGNOSIS — I4819 Other persistent atrial fibrillation: Secondary | ICD-10-CM | POA: Diagnosis not present

## 2018-11-30 DIAGNOSIS — I255 Ischemic cardiomyopathy: Secondary | ICD-10-CM | POA: Diagnosis not present

## 2018-11-30 DIAGNOSIS — Z95810 Presence of automatic (implantable) cardiac defibrillator: Secondary | ICD-10-CM | POA: Diagnosis not present

## 2018-11-30 DIAGNOSIS — I2510 Atherosclerotic heart disease of native coronary artery without angina pectoris: Secondary | ICD-10-CM | POA: Diagnosis not present

## 2018-11-30 DIAGNOSIS — I5022 Chronic systolic (congestive) heart failure: Secondary | ICD-10-CM | POA: Diagnosis not present

## 2018-11-30 DIAGNOSIS — I472 Ventricular tachycardia: Secondary | ICD-10-CM | POA: Diagnosis not present

## 2018-11-30 DIAGNOSIS — I484 Atypical atrial flutter: Secondary | ICD-10-CM | POA: Diagnosis not present

## 2018-12-02 DIAGNOSIS — I509 Heart failure, unspecified: Secondary | ICD-10-CM | POA: Diagnosis not present

## 2018-12-02 DIAGNOSIS — I2510 Atherosclerotic heart disease of native coronary artery without angina pectoris: Secondary | ICD-10-CM | POA: Diagnosis not present

## 2018-12-02 DIAGNOSIS — E7849 Other hyperlipidemia: Secondary | ICD-10-CM | POA: Diagnosis not present

## 2018-12-02 DIAGNOSIS — I270 Primary pulmonary hypertension: Secondary | ICD-10-CM | POA: Diagnosis not present

## 2018-12-02 DIAGNOSIS — I255 Ischemic cardiomyopathy: Secondary | ICD-10-CM | POA: Diagnosis not present

## 2018-12-02 DIAGNOSIS — F329 Major depressive disorder, single episode, unspecified: Secondary | ICD-10-CM | POA: Diagnosis not present

## 2018-12-02 DIAGNOSIS — I4891 Unspecified atrial fibrillation: Secondary | ICD-10-CM | POA: Diagnosis not present

## 2018-12-02 DIAGNOSIS — I10 Essential (primary) hypertension: Secondary | ICD-10-CM | POA: Diagnosis not present

## 2018-12-05 DIAGNOSIS — I5023 Acute on chronic systolic (congestive) heart failure: Secondary | ICD-10-CM | POA: Diagnosis not present

## 2018-12-05 DIAGNOSIS — I252 Old myocardial infarction: Secondary | ICD-10-CM | POA: Diagnosis not present

## 2018-12-05 DIAGNOSIS — I428 Other cardiomyopathies: Secondary | ICD-10-CM | POA: Diagnosis not present

## 2018-12-05 DIAGNOSIS — I272 Pulmonary hypertension, unspecified: Secondary | ICD-10-CM | POA: Diagnosis not present

## 2018-12-05 DIAGNOSIS — I081 Rheumatic disorders of both mitral and tricuspid valves: Secondary | ICD-10-CM | POA: Diagnosis not present

## 2018-12-05 DIAGNOSIS — I110 Hypertensive heart disease with heart failure: Secondary | ICD-10-CM | POA: Diagnosis not present

## 2018-12-05 DIAGNOSIS — I2510 Atherosclerotic heart disease of native coronary artery without angina pectoris: Secondary | ICD-10-CM | POA: Diagnosis not present

## 2018-12-05 DIAGNOSIS — I0981 Rheumatic heart failure: Secondary | ICD-10-CM | POA: Diagnosis not present

## 2018-12-05 DIAGNOSIS — I480 Paroxysmal atrial fibrillation: Secondary | ICD-10-CM | POA: Diagnosis not present

## 2018-12-15 DIAGNOSIS — I428 Other cardiomyopathies: Secondary | ICD-10-CM | POA: Diagnosis not present

## 2018-12-15 DIAGNOSIS — I480 Paroxysmal atrial fibrillation: Secondary | ICD-10-CM | POA: Diagnosis not present

## 2018-12-15 DIAGNOSIS — I110 Hypertensive heart disease with heart failure: Secondary | ICD-10-CM | POA: Diagnosis not present

## 2018-12-15 DIAGNOSIS — I5023 Acute on chronic systolic (congestive) heart failure: Secondary | ICD-10-CM | POA: Diagnosis not present

## 2018-12-15 DIAGNOSIS — I2510 Atherosclerotic heart disease of native coronary artery without angina pectoris: Secondary | ICD-10-CM | POA: Diagnosis not present

## 2018-12-15 DIAGNOSIS — I252 Old myocardial infarction: Secondary | ICD-10-CM | POA: Diagnosis not present

## 2018-12-15 DIAGNOSIS — I0981 Rheumatic heart failure: Secondary | ICD-10-CM | POA: Diagnosis not present

## 2018-12-15 DIAGNOSIS — I272 Pulmonary hypertension, unspecified: Secondary | ICD-10-CM | POA: Diagnosis not present

## 2018-12-15 DIAGNOSIS — I081 Rheumatic disorders of both mitral and tricuspid valves: Secondary | ICD-10-CM | POA: Diagnosis not present

## 2018-12-22 DIAGNOSIS — I739 Peripheral vascular disease, unspecified: Secondary | ICD-10-CM | POA: Diagnosis not present

## 2018-12-26 DIAGNOSIS — R8290 Unspecified abnormal findings in urine: Secondary | ICD-10-CM | POA: Diagnosis not present

## 2018-12-26 DIAGNOSIS — I248 Other forms of acute ischemic heart disease: Secondary | ICD-10-CM | POA: Diagnosis not present

## 2018-12-26 DIAGNOSIS — N390 Urinary tract infection, site not specified: Secondary | ICD-10-CM | POA: Diagnosis not present

## 2018-12-26 DIAGNOSIS — R0902 Hypoxemia: Secondary | ICD-10-CM | POA: Diagnosis not present

## 2018-12-26 DIAGNOSIS — I110 Hypertensive heart disease with heart failure: Secondary | ICD-10-CM | POA: Diagnosis not present

## 2018-12-26 DIAGNOSIS — R05 Cough: Secondary | ICD-10-CM | POA: Diagnosis not present

## 2018-12-26 DIAGNOSIS — R0789 Other chest pain: Secondary | ICD-10-CM | POA: Diagnosis not present

## 2018-12-26 DIAGNOSIS — I4891 Unspecified atrial fibrillation: Secondary | ICD-10-CM | POA: Diagnosis not present

## 2018-12-26 DIAGNOSIS — R609 Edema, unspecified: Secondary | ICD-10-CM | POA: Diagnosis not present

## 2018-12-26 DIAGNOSIS — Z23 Encounter for immunization: Secondary | ICD-10-CM | POA: Diagnosis not present

## 2018-12-26 DIAGNOSIS — R0602 Shortness of breath: Secondary | ICD-10-CM | POA: Diagnosis not present

## 2018-12-26 DIAGNOSIS — I491 Atrial premature depolarization: Secondary | ICD-10-CM | POA: Diagnosis not present

## 2018-12-26 DIAGNOSIS — I4821 Permanent atrial fibrillation: Secondary | ICD-10-CM | POA: Diagnosis not present

## 2018-12-26 DIAGNOSIS — I2510 Atherosclerotic heart disease of native coronary artery without angina pectoris: Secondary | ICD-10-CM | POA: Diagnosis not present

## 2018-12-26 DIAGNOSIS — I255 Ischemic cardiomyopathy: Secondary | ICD-10-CM | POA: Diagnosis not present

## 2018-12-26 DIAGNOSIS — J189 Pneumonia, unspecified organism: Secondary | ICD-10-CM | POA: Diagnosis not present

## 2018-12-26 DIAGNOSIS — R079 Chest pain, unspecified: Secondary | ICD-10-CM | POA: Diagnosis not present

## 2018-12-26 DIAGNOSIS — I5023 Acute on chronic systolic (congestive) heart failure: Secondary | ICD-10-CM | POA: Diagnosis not present

## 2018-12-27 DIAGNOSIS — I110 Hypertensive heart disease with heart failure: Secondary | ICD-10-CM | POA: Diagnosis not present

## 2018-12-27 DIAGNOSIS — I2510 Atherosclerotic heart disease of native coronary artery without angina pectoris: Secondary | ICD-10-CM | POA: Diagnosis not present

## 2018-12-27 DIAGNOSIS — R9431 Abnormal electrocardiogram [ECG] [EKG]: Secondary | ICD-10-CM | POA: Diagnosis not present

## 2018-12-27 DIAGNOSIS — I5043 Acute on chronic combined systolic (congestive) and diastolic (congestive) heart failure: Secondary | ICD-10-CM | POA: Diagnosis not present

## 2018-12-27 DIAGNOSIS — I4439 Other atrioventricular block: Secondary | ICD-10-CM | POA: Diagnosis not present

## 2018-12-27 DIAGNOSIS — I081 Rheumatic disorders of both mitral and tricuspid valves: Secondary | ICD-10-CM | POA: Diagnosis not present

## 2018-12-27 DIAGNOSIS — R8271 Bacteriuria: Secondary | ICD-10-CM | POA: Diagnosis not present

## 2018-12-27 DIAGNOSIS — I5023 Acute on chronic systolic (congestive) heart failure: Secondary | ICD-10-CM | POA: Diagnosis not present

## 2018-12-27 DIAGNOSIS — I480 Paroxysmal atrial fibrillation: Secondary | ICD-10-CM | POA: Diagnosis not present

## 2018-12-27 DIAGNOSIS — I272 Pulmonary hypertension, unspecified: Secondary | ICD-10-CM | POA: Diagnosis not present

## 2018-12-28 DIAGNOSIS — I4439 Other atrioventricular block: Secondary | ICD-10-CM | POA: Diagnosis not present

## 2018-12-28 DIAGNOSIS — N390 Urinary tract infection, site not specified: Secondary | ICD-10-CM | POA: Diagnosis not present

## 2018-12-28 DIAGNOSIS — I110 Hypertensive heart disease with heart failure: Secondary | ICD-10-CM | POA: Diagnosis not present

## 2018-12-28 DIAGNOSIS — I5043 Acute on chronic combined systolic (congestive) and diastolic (congestive) heart failure: Secondary | ICD-10-CM | POA: Diagnosis not present

## 2018-12-28 DIAGNOSIS — B9620 Unspecified Escherichia coli [E. coli] as the cause of diseases classified elsewhere: Secondary | ICD-10-CM | POA: Diagnosis not present

## 2018-12-28 DIAGNOSIS — I480 Paroxysmal atrial fibrillation: Secondary | ICD-10-CM | POA: Diagnosis not present

## 2018-12-28 DIAGNOSIS — R9431 Abnormal electrocardiogram [ECG] [EKG]: Secondary | ICD-10-CM | POA: Diagnosis not present

## 2018-12-28 DIAGNOSIS — I5023 Acute on chronic systolic (congestive) heart failure: Secondary | ICD-10-CM | POA: Diagnosis not present

## 2018-12-29 DIAGNOSIS — I110 Hypertensive heart disease with heart failure: Secondary | ICD-10-CM | POA: Diagnosis not present

## 2018-12-29 DIAGNOSIS — I5023 Acute on chronic systolic (congestive) heart failure: Secondary | ICD-10-CM | POA: Diagnosis not present

## 2018-12-29 DIAGNOSIS — I480 Paroxysmal atrial fibrillation: Secondary | ICD-10-CM | POA: Diagnosis not present

## 2018-12-29 DIAGNOSIS — B9620 Unspecified Escherichia coli [E. coli] as the cause of diseases classified elsewhere: Secondary | ICD-10-CM | POA: Diagnosis not present

## 2019-01-06 DIAGNOSIS — Z7901 Long term (current) use of anticoagulants: Secondary | ICD-10-CM | POA: Diagnosis not present

## 2019-01-06 DIAGNOSIS — R0602 Shortness of breath: Secondary | ICD-10-CM | POA: Diagnosis not present

## 2019-01-06 DIAGNOSIS — Z20828 Contact with and (suspected) exposure to other viral communicable diseases: Secondary | ICD-10-CM | POA: Diagnosis not present

## 2019-01-06 DIAGNOSIS — I4819 Other persistent atrial fibrillation: Secondary | ICD-10-CM | POA: Diagnosis not present

## 2019-01-06 DIAGNOSIS — E039 Hypothyroidism, unspecified: Secondary | ICD-10-CM | POA: Diagnosis not present

## 2019-01-06 DIAGNOSIS — I447 Left bundle-branch block, unspecified: Secondary | ICD-10-CM | POA: Diagnosis not present

## 2019-01-06 DIAGNOSIS — Z87891 Personal history of nicotine dependence: Secondary | ICD-10-CM | POA: Diagnosis not present

## 2019-01-06 DIAGNOSIS — R079 Chest pain, unspecified: Secondary | ICD-10-CM | POA: Diagnosis not present

## 2019-01-06 DIAGNOSIS — R918 Other nonspecific abnormal finding of lung field: Secondary | ICD-10-CM | POA: Diagnosis not present

## 2019-01-06 DIAGNOSIS — I4891 Unspecified atrial fibrillation: Secondary | ICD-10-CM | POA: Diagnosis not present

## 2019-01-06 DIAGNOSIS — I2510 Atherosclerotic heart disease of native coronary artery without angina pectoris: Secondary | ICD-10-CM | POA: Diagnosis not present

## 2019-01-06 DIAGNOSIS — I5033 Acute on chronic diastolic (congestive) heart failure: Secondary | ICD-10-CM | POA: Diagnosis not present

## 2019-01-06 DIAGNOSIS — I252 Old myocardial infarction: Secondary | ICD-10-CM | POA: Diagnosis not present

## 2019-01-06 DIAGNOSIS — I499 Cardiac arrhythmia, unspecified: Secondary | ICD-10-CM | POA: Diagnosis not present

## 2019-01-06 DIAGNOSIS — R0789 Other chest pain: Secondary | ICD-10-CM | POA: Diagnosis not present

## 2019-01-06 DIAGNOSIS — I110 Hypertensive heart disease with heart failure: Secondary | ICD-10-CM | POA: Diagnosis not present

## 2019-01-06 DIAGNOSIS — I5023 Acute on chronic systolic (congestive) heart failure: Secondary | ICD-10-CM | POA: Diagnosis not present

## 2019-01-06 DIAGNOSIS — K219 Gastro-esophageal reflux disease without esophagitis: Secondary | ICD-10-CM | POA: Diagnosis not present

## 2019-01-07 DIAGNOSIS — I5033 Acute on chronic diastolic (congestive) heart failure: Secondary | ICD-10-CM | POA: Diagnosis not present

## 2019-01-08 DIAGNOSIS — I5033 Acute on chronic diastolic (congestive) heart failure: Secondary | ICD-10-CM | POA: Diagnosis not present

## 2019-01-09 DIAGNOSIS — I5033 Acute on chronic diastolic (congestive) heart failure: Secondary | ICD-10-CM | POA: Diagnosis not present

## 2019-01-11 DIAGNOSIS — I272 Pulmonary hypertension, unspecified: Secondary | ICD-10-CM | POA: Diagnosis not present

## 2019-01-11 DIAGNOSIS — K219 Gastro-esophageal reflux disease without esophagitis: Secondary | ICD-10-CM | POA: Diagnosis not present

## 2019-01-11 DIAGNOSIS — I252 Old myocardial infarction: Secondary | ICD-10-CM | POA: Diagnosis not present

## 2019-01-11 DIAGNOSIS — I2510 Atherosclerotic heart disease of native coronary artery without angina pectoris: Secondary | ICD-10-CM | POA: Diagnosis not present

## 2019-01-11 DIAGNOSIS — E039 Hypothyroidism, unspecified: Secondary | ICD-10-CM | POA: Diagnosis not present

## 2019-01-11 DIAGNOSIS — I5043 Acute on chronic combined systolic (congestive) and diastolic (congestive) heart failure: Secondary | ICD-10-CM | POA: Diagnosis not present

## 2019-01-11 DIAGNOSIS — I479 Paroxysmal tachycardia, unspecified: Secondary | ICD-10-CM | POA: Diagnosis not present

## 2019-01-11 DIAGNOSIS — I255 Ischemic cardiomyopathy: Secondary | ICD-10-CM | POA: Diagnosis not present

## 2019-01-11 DIAGNOSIS — I4819 Other persistent atrial fibrillation: Secondary | ICD-10-CM | POA: Diagnosis not present

## 2019-01-13 DIAGNOSIS — I4821 Permanent atrial fibrillation: Secondary | ICD-10-CM | POA: Diagnosis not present

## 2019-01-13 DIAGNOSIS — I420 Dilated cardiomyopathy: Secondary | ICD-10-CM | POA: Diagnosis not present

## 2019-01-13 DIAGNOSIS — I255 Ischemic cardiomyopathy: Secondary | ICD-10-CM | POA: Diagnosis not present

## 2019-01-13 DIAGNOSIS — I5022 Chronic systolic (congestive) heart failure: Secondary | ICD-10-CM | POA: Diagnosis not present

## 2019-01-16 DIAGNOSIS — I420 Dilated cardiomyopathy: Secondary | ICD-10-CM | POA: Diagnosis not present

## 2019-01-16 DIAGNOSIS — Z95810 Presence of automatic (implantable) cardiac defibrillator: Secondary | ICD-10-CM | POA: Diagnosis not present

## 2019-01-16 DIAGNOSIS — I5022 Chronic systolic (congestive) heart failure: Secondary | ICD-10-CM | POA: Diagnosis not present

## 2019-01-16 DIAGNOSIS — I255 Ischemic cardiomyopathy: Secondary | ICD-10-CM | POA: Diagnosis not present

## 2019-01-16 DIAGNOSIS — I472 Ventricular tachycardia: Secondary | ICD-10-CM | POA: Diagnosis not present

## 2019-01-18 DIAGNOSIS — K219 Gastro-esophageal reflux disease without esophagitis: Secondary | ICD-10-CM | POA: Diagnosis not present

## 2019-01-18 DIAGNOSIS — I2510 Atherosclerotic heart disease of native coronary artery without angina pectoris: Secondary | ICD-10-CM | POA: Diagnosis not present

## 2019-01-18 DIAGNOSIS — E039 Hypothyroidism, unspecified: Secondary | ICD-10-CM | POA: Diagnosis not present

## 2019-01-18 DIAGNOSIS — I479 Paroxysmal tachycardia, unspecified: Secondary | ICD-10-CM | POA: Diagnosis not present

## 2019-01-18 DIAGNOSIS — I4819 Other persistent atrial fibrillation: Secondary | ICD-10-CM | POA: Diagnosis not present

## 2019-01-18 DIAGNOSIS — I272 Pulmonary hypertension, unspecified: Secondary | ICD-10-CM | POA: Diagnosis not present

## 2019-01-18 DIAGNOSIS — I252 Old myocardial infarction: Secondary | ICD-10-CM | POA: Diagnosis not present

## 2019-01-18 DIAGNOSIS — I5043 Acute on chronic combined systolic (congestive) and diastolic (congestive) heart failure: Secondary | ICD-10-CM | POA: Diagnosis not present

## 2019-01-18 DIAGNOSIS — I255 Ischemic cardiomyopathy: Secondary | ICD-10-CM | POA: Diagnosis not present

## 2019-01-20 DIAGNOSIS — E039 Hypothyroidism, unspecified: Secondary | ICD-10-CM | POA: Diagnosis not present

## 2019-01-20 DIAGNOSIS — I272 Pulmonary hypertension, unspecified: Secondary | ICD-10-CM | POA: Diagnosis not present

## 2019-01-20 DIAGNOSIS — I4819 Other persistent atrial fibrillation: Secondary | ICD-10-CM | POA: Diagnosis not present

## 2019-01-20 DIAGNOSIS — I255 Ischemic cardiomyopathy: Secondary | ICD-10-CM | POA: Diagnosis not present

## 2019-01-20 DIAGNOSIS — I2510 Atherosclerotic heart disease of native coronary artery without angina pectoris: Secondary | ICD-10-CM | POA: Diagnosis not present

## 2019-01-20 DIAGNOSIS — I479 Paroxysmal tachycardia, unspecified: Secondary | ICD-10-CM | POA: Diagnosis not present

## 2019-01-20 DIAGNOSIS — K219 Gastro-esophageal reflux disease without esophagitis: Secondary | ICD-10-CM | POA: Diagnosis not present

## 2019-01-20 DIAGNOSIS — I252 Old myocardial infarction: Secondary | ICD-10-CM | POA: Diagnosis not present

## 2019-01-20 DIAGNOSIS — I5043 Acute on chronic combined systolic (congestive) and diastolic (congestive) heart failure: Secondary | ICD-10-CM | POA: Diagnosis not present

## 2019-01-23 DIAGNOSIS — I4819 Other persistent atrial fibrillation: Secondary | ICD-10-CM | POA: Diagnosis not present

## 2019-01-23 DIAGNOSIS — I252 Old myocardial infarction: Secondary | ICD-10-CM | POA: Diagnosis not present

## 2019-01-23 DIAGNOSIS — E039 Hypothyroidism, unspecified: Secondary | ICD-10-CM | POA: Diagnosis not present

## 2019-01-23 DIAGNOSIS — I255 Ischemic cardiomyopathy: Secondary | ICD-10-CM | POA: Diagnosis not present

## 2019-01-23 DIAGNOSIS — I5043 Acute on chronic combined systolic (congestive) and diastolic (congestive) heart failure: Secondary | ICD-10-CM | POA: Diagnosis not present

## 2019-01-23 DIAGNOSIS — I272 Pulmonary hypertension, unspecified: Secondary | ICD-10-CM | POA: Diagnosis not present

## 2019-01-23 DIAGNOSIS — I479 Paroxysmal tachycardia, unspecified: Secondary | ICD-10-CM | POA: Diagnosis not present

## 2019-01-23 DIAGNOSIS — K219 Gastro-esophageal reflux disease without esophagitis: Secondary | ICD-10-CM | POA: Diagnosis not present

## 2019-01-23 DIAGNOSIS — I2510 Atherosclerotic heart disease of native coronary artery without angina pectoris: Secondary | ICD-10-CM | POA: Diagnosis not present

## 2019-01-24 DIAGNOSIS — I5043 Acute on chronic combined systolic (congestive) and diastolic (congestive) heart failure: Secondary | ICD-10-CM | POA: Diagnosis not present

## 2019-01-24 DIAGNOSIS — I2510 Atherosclerotic heart disease of native coronary artery without angina pectoris: Secondary | ICD-10-CM | POA: Diagnosis not present

## 2019-01-24 DIAGNOSIS — E039 Hypothyroidism, unspecified: Secondary | ICD-10-CM | POA: Diagnosis not present

## 2019-01-24 DIAGNOSIS — I272 Pulmonary hypertension, unspecified: Secondary | ICD-10-CM | POA: Diagnosis not present

## 2019-01-24 DIAGNOSIS — K219 Gastro-esophageal reflux disease without esophagitis: Secondary | ICD-10-CM | POA: Diagnosis not present

## 2019-01-24 DIAGNOSIS — I479 Paroxysmal tachycardia, unspecified: Secondary | ICD-10-CM | POA: Diagnosis not present

## 2019-01-24 DIAGNOSIS — I4819 Other persistent atrial fibrillation: Secondary | ICD-10-CM | POA: Diagnosis not present

## 2019-01-24 DIAGNOSIS — I255 Ischemic cardiomyopathy: Secondary | ICD-10-CM | POA: Diagnosis not present

## 2019-01-24 DIAGNOSIS — I252 Old myocardial infarction: Secondary | ICD-10-CM | POA: Diagnosis not present

## 2019-01-25 DIAGNOSIS — I5043 Acute on chronic combined systolic (congestive) and diastolic (congestive) heart failure: Secondary | ICD-10-CM | POA: Diagnosis not present

## 2019-01-25 DIAGNOSIS — I479 Paroxysmal tachycardia, unspecified: Secondary | ICD-10-CM | POA: Diagnosis not present

## 2019-01-25 DIAGNOSIS — K219 Gastro-esophageal reflux disease without esophagitis: Secondary | ICD-10-CM | POA: Diagnosis not present

## 2019-01-25 DIAGNOSIS — I4819 Other persistent atrial fibrillation: Secondary | ICD-10-CM | POA: Diagnosis not present

## 2019-01-25 DIAGNOSIS — E039 Hypothyroidism, unspecified: Secondary | ICD-10-CM | POA: Diagnosis not present

## 2019-01-25 DIAGNOSIS — I252 Old myocardial infarction: Secondary | ICD-10-CM | POA: Diagnosis not present

## 2019-01-25 DIAGNOSIS — I255 Ischemic cardiomyopathy: Secondary | ICD-10-CM | POA: Diagnosis not present

## 2019-01-25 DIAGNOSIS — I272 Pulmonary hypertension, unspecified: Secondary | ICD-10-CM | POA: Diagnosis not present

## 2019-01-25 DIAGNOSIS — I2510 Atherosclerotic heart disease of native coronary artery without angina pectoris: Secondary | ICD-10-CM | POA: Diagnosis not present

## 2019-01-26 DIAGNOSIS — Z87440 Personal history of urinary (tract) infections: Secondary | ICD-10-CM | POA: Diagnosis not present

## 2019-01-26 DIAGNOSIS — D649 Anemia, unspecified: Secondary | ICD-10-CM | POA: Diagnosis not present

## 2019-01-26 DIAGNOSIS — I10 Essential (primary) hypertension: Secondary | ICD-10-CM | POA: Diagnosis not present

## 2019-01-26 DIAGNOSIS — I509 Heart failure, unspecified: Secondary | ICD-10-CM | POA: Diagnosis not present

## 2019-01-30 DIAGNOSIS — R072 Precordial pain: Secondary | ICD-10-CM | POA: Diagnosis not present

## 2019-01-30 DIAGNOSIS — Z209 Contact with and (suspected) exposure to unspecified communicable disease: Secondary | ICD-10-CM | POA: Diagnosis not present

## 2019-01-30 DIAGNOSIS — I2510 Atherosclerotic heart disease of native coronary artery without angina pectoris: Secondary | ICD-10-CM | POA: Diagnosis not present

## 2019-01-30 DIAGNOSIS — I454 Nonspecific intraventricular block: Secondary | ICD-10-CM | POA: Diagnosis not present

## 2019-01-30 DIAGNOSIS — R0602 Shortness of breath: Secondary | ICD-10-CM | POA: Diagnosis not present

## 2019-01-30 DIAGNOSIS — Z79899 Other long term (current) drug therapy: Secondary | ICD-10-CM | POA: Diagnosis not present

## 2019-01-30 DIAGNOSIS — I110 Hypertensive heart disease with heart failure: Secondary | ICD-10-CM | POA: Diagnosis not present

## 2019-01-30 DIAGNOSIS — R112 Nausea with vomiting, unspecified: Secondary | ICD-10-CM | POA: Diagnosis not present

## 2019-01-30 DIAGNOSIS — R7989 Other specified abnormal findings of blood chemistry: Secondary | ICD-10-CM | POA: Diagnosis not present

## 2019-01-30 DIAGNOSIS — Z7901 Long term (current) use of anticoagulants: Secondary | ICD-10-CM | POA: Diagnosis not present

## 2019-01-30 DIAGNOSIS — R0789 Other chest pain: Secondary | ICD-10-CM | POA: Diagnosis not present

## 2019-01-30 DIAGNOSIS — R9431 Abnormal electrocardiogram [ECG] [EKG]: Secondary | ICD-10-CM | POA: Diagnosis not present

## 2019-01-30 DIAGNOSIS — I5023 Acute on chronic systolic (congestive) heart failure: Secondary | ICD-10-CM | POA: Diagnosis not present

## 2019-01-30 DIAGNOSIS — Z7902 Long term (current) use of antithrombotics/antiplatelets: Secondary | ICD-10-CM | POA: Diagnosis not present

## 2019-01-30 DIAGNOSIS — I509 Heart failure, unspecified: Secondary | ICD-10-CM | POA: Diagnosis not present

## 2019-01-30 DIAGNOSIS — R110 Nausea: Secondary | ICD-10-CM | POA: Diagnosis not present

## 2019-01-30 DIAGNOSIS — R778 Other specified abnormalities of plasma proteins: Secondary | ICD-10-CM | POA: Diagnosis not present

## 2019-01-30 DIAGNOSIS — R079 Chest pain, unspecified: Secondary | ICD-10-CM | POA: Diagnosis not present

## 2019-01-30 DIAGNOSIS — I4891 Unspecified atrial fibrillation: Secondary | ICD-10-CM | POA: Diagnosis not present

## 2019-01-30 DIAGNOSIS — R05 Cough: Secondary | ICD-10-CM | POA: Diagnosis not present

## 2019-01-30 DIAGNOSIS — I480 Paroxysmal atrial fibrillation: Secondary | ICD-10-CM | POA: Diagnosis not present

## 2019-01-31 DIAGNOSIS — I4891 Unspecified atrial fibrillation: Secondary | ICD-10-CM | POA: Diagnosis not present

## 2019-01-31 DIAGNOSIS — R0789 Other chest pain: Secondary | ICD-10-CM | POA: Diagnosis not present

## 2019-01-31 DIAGNOSIS — I5023 Acute on chronic systolic (congestive) heart failure: Secondary | ICD-10-CM | POA: Diagnosis not present

## 2019-01-31 DIAGNOSIS — I509 Heart failure, unspecified: Secondary | ICD-10-CM | POA: Diagnosis not present

## 2019-01-31 DIAGNOSIS — F329 Major depressive disorder, single episode, unspecified: Secondary | ICD-10-CM | POA: Diagnosis not present

## 2019-01-31 DIAGNOSIS — I10 Essential (primary) hypertension: Secondary | ICD-10-CM | POA: Diagnosis not present

## 2019-01-31 DIAGNOSIS — I480 Paroxysmal atrial fibrillation: Secondary | ICD-10-CM | POA: Diagnosis not present

## 2019-01-31 DIAGNOSIS — I255 Ischemic cardiomyopathy: Secondary | ICD-10-CM | POA: Diagnosis not present

## 2019-01-31 DIAGNOSIS — I270 Primary pulmonary hypertension: Secondary | ICD-10-CM | POA: Diagnosis not present

## 2019-01-31 DIAGNOSIS — R0602 Shortness of breath: Secondary | ICD-10-CM | POA: Diagnosis not present

## 2019-01-31 DIAGNOSIS — E7841 Elevated Lipoprotein(a): Secondary | ICD-10-CM | POA: Diagnosis not present

## 2019-01-31 DIAGNOSIS — I2510 Atherosclerotic heart disease of native coronary artery without angina pectoris: Secondary | ICD-10-CM | POA: Diagnosis not present

## 2019-02-02 DIAGNOSIS — I255 Ischemic cardiomyopathy: Secondary | ICD-10-CM | POA: Diagnosis not present

## 2019-02-02 DIAGNOSIS — I4819 Other persistent atrial fibrillation: Secondary | ICD-10-CM | POA: Diagnosis not present

## 2019-02-02 DIAGNOSIS — I252 Old myocardial infarction: Secondary | ICD-10-CM | POA: Diagnosis not present

## 2019-02-02 DIAGNOSIS — I479 Paroxysmal tachycardia, unspecified: Secondary | ICD-10-CM | POA: Diagnosis not present

## 2019-02-02 DIAGNOSIS — I5043 Acute on chronic combined systolic (congestive) and diastolic (congestive) heart failure: Secondary | ICD-10-CM | POA: Diagnosis not present

## 2019-02-02 DIAGNOSIS — I2510 Atherosclerotic heart disease of native coronary artery without angina pectoris: Secondary | ICD-10-CM | POA: Diagnosis not present

## 2019-02-02 DIAGNOSIS — E039 Hypothyroidism, unspecified: Secondary | ICD-10-CM | POA: Diagnosis not present

## 2019-02-02 DIAGNOSIS — K219 Gastro-esophageal reflux disease without esophagitis: Secondary | ICD-10-CM | POA: Diagnosis not present

## 2019-02-02 DIAGNOSIS — I272 Pulmonary hypertension, unspecified: Secondary | ICD-10-CM | POA: Diagnosis not present

## 2019-02-08 DIAGNOSIS — Z4502 Encounter for adjustment and management of automatic implantable cardiac defibrillator: Secondary | ICD-10-CM | POA: Diagnosis not present

## 2019-02-10 DIAGNOSIS — I4819 Other persistent atrial fibrillation: Secondary | ICD-10-CM | POA: Diagnosis not present

## 2019-02-10 DIAGNOSIS — I272 Pulmonary hypertension, unspecified: Secondary | ICD-10-CM | POA: Diagnosis not present

## 2019-02-10 DIAGNOSIS — I5043 Acute on chronic combined systolic (congestive) and diastolic (congestive) heart failure: Secondary | ICD-10-CM | POA: Diagnosis not present

## 2019-02-10 DIAGNOSIS — E039 Hypothyroidism, unspecified: Secondary | ICD-10-CM | POA: Diagnosis not present

## 2019-02-10 DIAGNOSIS — I255 Ischemic cardiomyopathy: Secondary | ICD-10-CM | POA: Diagnosis not present

## 2019-02-10 DIAGNOSIS — K219 Gastro-esophageal reflux disease without esophagitis: Secondary | ICD-10-CM | POA: Diagnosis not present

## 2019-02-10 DIAGNOSIS — I252 Old myocardial infarction: Secondary | ICD-10-CM | POA: Diagnosis not present

## 2019-02-10 DIAGNOSIS — I2510 Atherosclerotic heart disease of native coronary artery without angina pectoris: Secondary | ICD-10-CM | POA: Diagnosis not present

## 2019-02-10 DIAGNOSIS — I479 Paroxysmal tachycardia, unspecified: Secondary | ICD-10-CM | POA: Diagnosis not present

## 2019-02-13 DIAGNOSIS — I272 Pulmonary hypertension, unspecified: Secondary | ICD-10-CM | POA: Diagnosis not present

## 2019-02-13 DIAGNOSIS — K219 Gastro-esophageal reflux disease without esophagitis: Secondary | ICD-10-CM | POA: Diagnosis not present

## 2019-02-13 DIAGNOSIS — I2510 Atherosclerotic heart disease of native coronary artery without angina pectoris: Secondary | ICD-10-CM | POA: Diagnosis not present

## 2019-02-13 DIAGNOSIS — I4819 Other persistent atrial fibrillation: Secondary | ICD-10-CM | POA: Diagnosis not present

## 2019-02-13 DIAGNOSIS — E039 Hypothyroidism, unspecified: Secondary | ICD-10-CM | POA: Diagnosis not present

## 2019-02-13 DIAGNOSIS — I255 Ischemic cardiomyopathy: Secondary | ICD-10-CM | POA: Diagnosis not present

## 2019-02-13 DIAGNOSIS — I5043 Acute on chronic combined systolic (congestive) and diastolic (congestive) heart failure: Secondary | ICD-10-CM | POA: Diagnosis not present

## 2019-02-13 DIAGNOSIS — I252 Old myocardial infarction: Secondary | ICD-10-CM | POA: Diagnosis not present

## 2019-02-13 DIAGNOSIS — I479 Paroxysmal tachycardia, unspecified: Secondary | ICD-10-CM | POA: Diagnosis not present

## 2019-02-15 DIAGNOSIS — I4819 Other persistent atrial fibrillation: Secondary | ICD-10-CM | POA: Diagnosis not present

## 2019-02-15 DIAGNOSIS — I252 Old myocardial infarction: Secondary | ICD-10-CM | POA: Diagnosis not present

## 2019-02-15 DIAGNOSIS — I2510 Atherosclerotic heart disease of native coronary artery without angina pectoris: Secondary | ICD-10-CM | POA: Diagnosis not present

## 2019-02-15 DIAGNOSIS — I272 Pulmonary hypertension, unspecified: Secondary | ICD-10-CM | POA: Diagnosis not present

## 2019-02-15 DIAGNOSIS — I5043 Acute on chronic combined systolic (congestive) and diastolic (congestive) heart failure: Secondary | ICD-10-CM | POA: Diagnosis not present

## 2019-02-15 DIAGNOSIS — I255 Ischemic cardiomyopathy: Secondary | ICD-10-CM | POA: Diagnosis not present

## 2019-02-15 DIAGNOSIS — K219 Gastro-esophageal reflux disease without esophagitis: Secondary | ICD-10-CM | POA: Diagnosis not present

## 2019-02-15 DIAGNOSIS — E039 Hypothyroidism, unspecified: Secondary | ICD-10-CM | POA: Diagnosis not present

## 2019-02-15 DIAGNOSIS — I479 Paroxysmal tachycardia, unspecified: Secondary | ICD-10-CM | POA: Diagnosis not present

## 2019-02-19 DIAGNOSIS — I2510 Atherosclerotic heart disease of native coronary artery without angina pectoris: Secondary | ICD-10-CM | POA: Diagnosis not present

## 2019-02-19 DIAGNOSIS — I272 Pulmonary hypertension, unspecified: Secondary | ICD-10-CM | POA: Diagnosis not present

## 2019-02-19 DIAGNOSIS — I5043 Acute on chronic combined systolic (congestive) and diastolic (congestive) heart failure: Secondary | ICD-10-CM | POA: Diagnosis not present

## 2019-02-19 DIAGNOSIS — I252 Old myocardial infarction: Secondary | ICD-10-CM | POA: Diagnosis not present

## 2019-02-19 DIAGNOSIS — I479 Paroxysmal tachycardia, unspecified: Secondary | ICD-10-CM | POA: Diagnosis not present

## 2019-02-19 DIAGNOSIS — I255 Ischemic cardiomyopathy: Secondary | ICD-10-CM | POA: Diagnosis not present

## 2019-02-19 DIAGNOSIS — K219 Gastro-esophageal reflux disease without esophagitis: Secondary | ICD-10-CM | POA: Diagnosis not present

## 2019-02-19 DIAGNOSIS — I4819 Other persistent atrial fibrillation: Secondary | ICD-10-CM | POA: Diagnosis not present

## 2019-02-19 DIAGNOSIS — E039 Hypothyroidism, unspecified: Secondary | ICD-10-CM | POA: Diagnosis not present

## 2019-02-21 DIAGNOSIS — I5043 Acute on chronic combined systolic (congestive) and diastolic (congestive) heart failure: Secondary | ICD-10-CM | POA: Diagnosis not present

## 2019-02-21 DIAGNOSIS — K219 Gastro-esophageal reflux disease without esophagitis: Secondary | ICD-10-CM | POA: Diagnosis not present

## 2019-02-21 DIAGNOSIS — I4819 Other persistent atrial fibrillation: Secondary | ICD-10-CM | POA: Diagnosis not present

## 2019-02-21 DIAGNOSIS — I255 Ischemic cardiomyopathy: Secondary | ICD-10-CM | POA: Diagnosis not present

## 2019-02-21 DIAGNOSIS — I2510 Atherosclerotic heart disease of native coronary artery without angina pectoris: Secondary | ICD-10-CM | POA: Diagnosis not present

## 2019-02-21 DIAGNOSIS — E039 Hypothyroidism, unspecified: Secondary | ICD-10-CM | POA: Diagnosis not present

## 2019-02-21 DIAGNOSIS — I272 Pulmonary hypertension, unspecified: Secondary | ICD-10-CM | POA: Diagnosis not present

## 2019-02-21 DIAGNOSIS — I479 Paroxysmal tachycardia, unspecified: Secondary | ICD-10-CM | POA: Diagnosis not present

## 2019-02-21 DIAGNOSIS — I252 Old myocardial infarction: Secondary | ICD-10-CM | POA: Diagnosis not present

## 2019-02-23 DIAGNOSIS — I272 Pulmonary hypertension, unspecified: Secondary | ICD-10-CM | POA: Diagnosis not present

## 2019-02-23 DIAGNOSIS — E039 Hypothyroidism, unspecified: Secondary | ICD-10-CM | POA: Diagnosis not present

## 2019-02-23 DIAGNOSIS — I255 Ischemic cardiomyopathy: Secondary | ICD-10-CM | POA: Diagnosis not present

## 2019-02-23 DIAGNOSIS — I479 Paroxysmal tachycardia, unspecified: Secondary | ICD-10-CM | POA: Diagnosis not present

## 2019-02-23 DIAGNOSIS — I2510 Atherosclerotic heart disease of native coronary artery without angina pectoris: Secondary | ICD-10-CM | POA: Diagnosis not present

## 2019-02-23 DIAGNOSIS — I252 Old myocardial infarction: Secondary | ICD-10-CM | POA: Diagnosis not present

## 2019-02-23 DIAGNOSIS — K219 Gastro-esophageal reflux disease without esophagitis: Secondary | ICD-10-CM | POA: Diagnosis not present

## 2019-02-23 DIAGNOSIS — I4819 Other persistent atrial fibrillation: Secondary | ICD-10-CM | POA: Diagnosis not present

## 2019-02-23 DIAGNOSIS — I5043 Acute on chronic combined systolic (congestive) and diastolic (congestive) heart failure: Secondary | ICD-10-CM | POA: Diagnosis not present

## 2019-02-25 DIAGNOSIS — I2510 Atherosclerotic heart disease of native coronary artery without angina pectoris: Secondary | ICD-10-CM | POA: Diagnosis not present

## 2019-02-25 DIAGNOSIS — I272 Pulmonary hypertension, unspecified: Secondary | ICD-10-CM | POA: Diagnosis not present

## 2019-02-25 DIAGNOSIS — E039 Hypothyroidism, unspecified: Secondary | ICD-10-CM | POA: Diagnosis not present

## 2019-02-25 DIAGNOSIS — I4819 Other persistent atrial fibrillation: Secondary | ICD-10-CM | POA: Diagnosis not present

## 2019-02-25 DIAGNOSIS — I252 Old myocardial infarction: Secondary | ICD-10-CM | POA: Diagnosis not present

## 2019-02-25 DIAGNOSIS — I255 Ischemic cardiomyopathy: Secondary | ICD-10-CM | POA: Diagnosis not present

## 2019-02-25 DIAGNOSIS — K219 Gastro-esophageal reflux disease without esophagitis: Secondary | ICD-10-CM | POA: Diagnosis not present

## 2019-02-25 DIAGNOSIS — I479 Paroxysmal tachycardia, unspecified: Secondary | ICD-10-CM | POA: Diagnosis not present

## 2019-02-25 DIAGNOSIS — I5043 Acute on chronic combined systolic (congestive) and diastolic (congestive) heart failure: Secondary | ICD-10-CM | POA: Diagnosis not present

## 2019-02-28 DIAGNOSIS — I2510 Atherosclerotic heart disease of native coronary artery without angina pectoris: Secondary | ICD-10-CM | POA: Diagnosis not present

## 2019-02-28 DIAGNOSIS — I4819 Other persistent atrial fibrillation: Secondary | ICD-10-CM | POA: Diagnosis not present

## 2019-02-28 DIAGNOSIS — I5043 Acute on chronic combined systolic (congestive) and diastolic (congestive) heart failure: Secondary | ICD-10-CM | POA: Diagnosis not present

## 2019-02-28 DIAGNOSIS — I272 Pulmonary hypertension, unspecified: Secondary | ICD-10-CM | POA: Diagnosis not present

## 2019-02-28 DIAGNOSIS — I252 Old myocardial infarction: Secondary | ICD-10-CM | POA: Diagnosis not present

## 2019-02-28 DIAGNOSIS — K219 Gastro-esophageal reflux disease without esophagitis: Secondary | ICD-10-CM | POA: Diagnosis not present

## 2019-02-28 DIAGNOSIS — I255 Ischemic cardiomyopathy: Secondary | ICD-10-CM | POA: Diagnosis not present

## 2019-02-28 DIAGNOSIS — I479 Paroxysmal tachycardia, unspecified: Secondary | ICD-10-CM | POA: Diagnosis not present

## 2019-02-28 DIAGNOSIS — E039 Hypothyroidism, unspecified: Secondary | ICD-10-CM | POA: Diagnosis not present

## 2019-03-01 DIAGNOSIS — E039 Hypothyroidism, unspecified: Secondary | ICD-10-CM | POA: Diagnosis not present

## 2019-03-01 DIAGNOSIS — I255 Ischemic cardiomyopathy: Secondary | ICD-10-CM | POA: Diagnosis not present

## 2019-03-01 DIAGNOSIS — I2510 Atherosclerotic heart disease of native coronary artery without angina pectoris: Secondary | ICD-10-CM | POA: Diagnosis not present

## 2019-03-01 DIAGNOSIS — I5043 Acute on chronic combined systolic (congestive) and diastolic (congestive) heart failure: Secondary | ICD-10-CM | POA: Diagnosis not present

## 2019-03-01 DIAGNOSIS — I4819 Other persistent atrial fibrillation: Secondary | ICD-10-CM | POA: Diagnosis not present

## 2019-03-01 DIAGNOSIS — I252 Old myocardial infarction: Secondary | ICD-10-CM | POA: Diagnosis not present

## 2019-03-01 DIAGNOSIS — I479 Paroxysmal tachycardia, unspecified: Secondary | ICD-10-CM | POA: Diagnosis not present

## 2019-03-01 DIAGNOSIS — K219 Gastro-esophageal reflux disease without esophagitis: Secondary | ICD-10-CM | POA: Diagnosis not present

## 2019-03-01 DIAGNOSIS — I272 Pulmonary hypertension, unspecified: Secondary | ICD-10-CM | POA: Diagnosis not present

## 2019-03-02 DIAGNOSIS — Z79899 Other long term (current) drug therapy: Secondary | ICD-10-CM | POA: Diagnosis not present

## 2019-03-02 DIAGNOSIS — E7849 Other hyperlipidemia: Secondary | ICD-10-CM | POA: Diagnosis not present

## 2019-03-02 DIAGNOSIS — I509 Heart failure, unspecified: Secondary | ICD-10-CM | POA: Diagnosis not present

## 2019-03-02 DIAGNOSIS — I110 Hypertensive heart disease with heart failure: Secondary | ICD-10-CM | POA: Diagnosis not present

## 2019-03-02 DIAGNOSIS — I5023 Acute on chronic systolic (congestive) heart failure: Secondary | ICD-10-CM | POA: Diagnosis not present

## 2019-03-02 DIAGNOSIS — I10 Essential (primary) hypertension: Secondary | ICD-10-CM | POA: Diagnosis not present

## 2019-03-02 DIAGNOSIS — I480 Paroxysmal atrial fibrillation: Secondary | ICD-10-CM | POA: Diagnosis not present

## 2019-03-02 DIAGNOSIS — E785 Hyperlipidemia, unspecified: Secondary | ICD-10-CM | POA: Diagnosis not present

## 2019-03-02 DIAGNOSIS — Z95810 Presence of automatic (implantable) cardiac defibrillator: Secondary | ICD-10-CM | POA: Diagnosis not present

## 2019-03-02 DIAGNOSIS — I2510 Atherosclerotic heart disease of native coronary artery without angina pectoris: Secondary | ICD-10-CM | POA: Diagnosis not present

## 2019-03-02 DIAGNOSIS — F329 Major depressive disorder, single episode, unspecified: Secondary | ICD-10-CM | POA: Diagnosis not present

## 2019-03-02 DIAGNOSIS — Z87891 Personal history of nicotine dependence: Secondary | ICD-10-CM | POA: Diagnosis not present

## 2019-03-02 DIAGNOSIS — I255 Ischemic cardiomyopathy: Secondary | ICD-10-CM | POA: Diagnosis not present

## 2019-03-02 DIAGNOSIS — I270 Primary pulmonary hypertension: Secondary | ICD-10-CM | POA: Diagnosis not present

## 2019-03-02 DIAGNOSIS — I4891 Unspecified atrial fibrillation: Secondary | ICD-10-CM | POA: Diagnosis not present

## 2019-03-02 DIAGNOSIS — Z7901 Long term (current) use of anticoagulants: Secondary | ICD-10-CM | POA: Diagnosis not present

## 2019-03-02 DIAGNOSIS — I25118 Atherosclerotic heart disease of native coronary artery with other forms of angina pectoris: Secondary | ICD-10-CM | POA: Diagnosis not present

## 2019-03-03 ENCOUNTER — Telehealth (HOSPITAL_COMMUNITY): Payer: Self-pay | Admitting: *Deleted

## 2019-03-03 DIAGNOSIS — I479 Paroxysmal tachycardia, unspecified: Secondary | ICD-10-CM | POA: Diagnosis not present

## 2019-03-03 DIAGNOSIS — I4819 Other persistent atrial fibrillation: Secondary | ICD-10-CM | POA: Diagnosis not present

## 2019-03-03 DIAGNOSIS — I272 Pulmonary hypertension, unspecified: Secondary | ICD-10-CM | POA: Diagnosis not present

## 2019-03-03 DIAGNOSIS — I252 Old myocardial infarction: Secondary | ICD-10-CM | POA: Diagnosis not present

## 2019-03-03 DIAGNOSIS — K219 Gastro-esophageal reflux disease without esophagitis: Secondary | ICD-10-CM | POA: Diagnosis not present

## 2019-03-03 DIAGNOSIS — I255 Ischemic cardiomyopathy: Secondary | ICD-10-CM | POA: Diagnosis not present

## 2019-03-03 DIAGNOSIS — I5043 Acute on chronic combined systolic (congestive) and diastolic (congestive) heart failure: Secondary | ICD-10-CM | POA: Diagnosis not present

## 2019-03-03 DIAGNOSIS — I2510 Atherosclerotic heart disease of native coronary artery without angina pectoris: Secondary | ICD-10-CM | POA: Diagnosis not present

## 2019-03-03 DIAGNOSIS — E039 Hypothyroidism, unspecified: Secondary | ICD-10-CM | POA: Diagnosis not present

## 2019-03-03 NOTE — Telephone Encounter (Signed)

## 2019-03-05 ENCOUNTER — Other Ambulatory Visit: Payer: Self-pay

## 2019-03-05 DIAGNOSIS — I739 Peripheral vascular disease, unspecified: Secondary | ICD-10-CM

## 2019-03-05 DIAGNOSIS — Z9889 Other specified postprocedural states: Secondary | ICD-10-CM

## 2019-03-05 NOTE — Addendum Note (Signed)
Addended by: Karn Cassis on: 03/05/2019 08:53 PM   Modules accepted: Orders

## 2019-03-06 ENCOUNTER — Encounter: Payer: Medicare HMO | Admitting: Surgery

## 2019-03-06 ENCOUNTER — Encounter (HOSPITAL_COMMUNITY): Payer: Medicare HMO

## 2019-03-06 ENCOUNTER — Other Ambulatory Visit (HOSPITAL_COMMUNITY): Payer: Medicare HMO

## 2019-03-06 DIAGNOSIS — I272 Pulmonary hypertension, unspecified: Secondary | ICD-10-CM | POA: Diagnosis not present

## 2019-03-06 DIAGNOSIS — I255 Ischemic cardiomyopathy: Secondary | ICD-10-CM | POA: Diagnosis not present

## 2019-03-06 DIAGNOSIS — I2510 Atherosclerotic heart disease of native coronary artery without angina pectoris: Secondary | ICD-10-CM | POA: Diagnosis not present

## 2019-03-06 DIAGNOSIS — E039 Hypothyroidism, unspecified: Secondary | ICD-10-CM | POA: Diagnosis not present

## 2019-03-06 DIAGNOSIS — I252 Old myocardial infarction: Secondary | ICD-10-CM | POA: Diagnosis not present

## 2019-03-06 DIAGNOSIS — K219 Gastro-esophageal reflux disease without esophagitis: Secondary | ICD-10-CM | POA: Diagnosis not present

## 2019-03-06 DIAGNOSIS — I4819 Other persistent atrial fibrillation: Secondary | ICD-10-CM | POA: Diagnosis not present

## 2019-03-06 DIAGNOSIS — I5043 Acute on chronic combined systolic (congestive) and diastolic (congestive) heart failure: Secondary | ICD-10-CM | POA: Diagnosis not present

## 2019-03-06 DIAGNOSIS — I479 Paroxysmal tachycardia, unspecified: Secondary | ICD-10-CM | POA: Diagnosis not present

## 2019-03-13 DIAGNOSIS — R0602 Shortness of breath: Secondary | ICD-10-CM | POA: Diagnosis not present

## 2019-03-13 DIAGNOSIS — I472 Ventricular tachycardia: Secondary | ICD-10-CM | POA: Diagnosis not present

## 2019-03-13 DIAGNOSIS — I420 Dilated cardiomyopathy: Secondary | ICD-10-CM | POA: Diagnosis not present

## 2019-03-13 DIAGNOSIS — Z951 Presence of aortocoronary bypass graft: Secondary | ICD-10-CM | POA: Diagnosis not present

## 2019-03-13 DIAGNOSIS — I5022 Chronic systolic (congestive) heart failure: Secondary | ICD-10-CM | POA: Diagnosis not present

## 2019-03-13 DIAGNOSIS — I255 Ischemic cardiomyopathy: Secondary | ICD-10-CM | POA: Diagnosis not present

## 2019-03-14 DIAGNOSIS — Z7409 Other reduced mobility: Secondary | ICD-10-CM | POA: Diagnosis not present

## 2019-03-14 DIAGNOSIS — I454 Nonspecific intraventricular block: Secondary | ICD-10-CM | POA: Diagnosis not present

## 2019-03-14 DIAGNOSIS — Z66 Do not resuscitate: Secondary | ICD-10-CM | POA: Diagnosis not present

## 2019-03-14 DIAGNOSIS — R1084 Generalized abdominal pain: Secondary | ICD-10-CM | POA: Diagnosis not present

## 2019-03-14 DIAGNOSIS — R0789 Other chest pain: Secondary | ICD-10-CM | POA: Diagnosis not present

## 2019-03-14 DIAGNOSIS — I5023 Acute on chronic systolic (congestive) heart failure: Secondary | ICD-10-CM | POA: Diagnosis not present

## 2019-03-14 DIAGNOSIS — I25118 Atherosclerotic heart disease of native coronary artery with other forms of angina pectoris: Secondary | ICD-10-CM | POA: Diagnosis not present

## 2019-03-14 DIAGNOSIS — R2689 Other abnormalities of gait and mobility: Secondary | ICD-10-CM | POA: Diagnosis not present

## 2019-03-14 DIAGNOSIS — K219 Gastro-esophageal reflux disease without esophagitis: Secondary | ICD-10-CM | POA: Diagnosis not present

## 2019-03-14 DIAGNOSIS — I2510 Atherosclerotic heart disease of native coronary artery without angina pectoris: Secondary | ICD-10-CM | POA: Diagnosis not present

## 2019-03-14 DIAGNOSIS — I10 Essential (primary) hypertension: Secondary | ICD-10-CM | POA: Diagnosis not present

## 2019-03-14 DIAGNOSIS — R079 Chest pain, unspecified: Secondary | ICD-10-CM | POA: Diagnosis not present

## 2019-03-14 DIAGNOSIS — I255 Ischemic cardiomyopathy: Secondary | ICD-10-CM | POA: Diagnosis not present

## 2019-03-14 DIAGNOSIS — Z7901 Long term (current) use of anticoagulants: Secondary | ICD-10-CM | POA: Diagnosis not present

## 2019-03-14 DIAGNOSIS — Z95810 Presence of automatic (implantable) cardiac defibrillator: Secondary | ICD-10-CM | POA: Diagnosis not present

## 2019-03-14 DIAGNOSIS — I4891 Unspecified atrial fibrillation: Secondary | ICD-10-CM | POA: Diagnosis not present

## 2019-03-14 DIAGNOSIS — E7800 Pure hypercholesterolemia, unspecified: Secondary | ICD-10-CM | POA: Diagnosis not present

## 2019-03-14 DIAGNOSIS — I110 Hypertensive heart disease with heart failure: Secondary | ICD-10-CM | POA: Diagnosis not present

## 2019-03-14 DIAGNOSIS — I509 Heart failure, unspecified: Secondary | ICD-10-CM | POA: Diagnosis not present

## 2019-03-14 DIAGNOSIS — I480 Paroxysmal atrial fibrillation: Secondary | ICD-10-CM | POA: Diagnosis not present

## 2019-03-14 DIAGNOSIS — I513 Intracardiac thrombosis, not elsewhere classified: Secondary | ICD-10-CM | POA: Diagnosis not present

## 2019-03-14 DIAGNOSIS — R52 Pain, unspecified: Secondary | ICD-10-CM | POA: Diagnosis not present

## 2019-03-14 DIAGNOSIS — I493 Ventricular premature depolarization: Secondary | ICD-10-CM | POA: Diagnosis not present

## 2019-03-15 DIAGNOSIS — I493 Ventricular premature depolarization: Secondary | ICD-10-CM | POA: Diagnosis not present

## 2019-03-15 DIAGNOSIS — I2510 Atherosclerotic heart disease of native coronary artery without angina pectoris: Secondary | ICD-10-CM | POA: Diagnosis not present

## 2019-03-15 DIAGNOSIS — I480 Paroxysmal atrial fibrillation: Secondary | ICD-10-CM | POA: Diagnosis not present

## 2019-03-15 DIAGNOSIS — I509 Heart failure, unspecified: Secondary | ICD-10-CM | POA: Diagnosis not present

## 2019-03-15 DIAGNOSIS — R079 Chest pain, unspecified: Secondary | ICD-10-CM | POA: Diagnosis not present

## 2019-03-15 DIAGNOSIS — I5023 Acute on chronic systolic (congestive) heart failure: Secondary | ICD-10-CM | POA: Diagnosis not present

## 2019-03-15 DIAGNOSIS — I255 Ischemic cardiomyopathy: Secondary | ICD-10-CM | POA: Diagnosis not present

## 2019-03-15 DIAGNOSIS — I10 Essential (primary) hypertension: Secondary | ICD-10-CM | POA: Diagnosis not present

## 2019-03-15 DIAGNOSIS — K219 Gastro-esophageal reflux disease without esophagitis: Secondary | ICD-10-CM | POA: Diagnosis not present

## 2019-03-15 DIAGNOSIS — Z7409 Other reduced mobility: Secondary | ICD-10-CM | POA: Diagnosis not present

## 2019-03-15 DIAGNOSIS — I513 Intracardiac thrombosis, not elsewhere classified: Secondary | ICD-10-CM | POA: Diagnosis not present

## 2019-03-15 DIAGNOSIS — I454 Nonspecific intraventricular block: Secondary | ICD-10-CM | POA: Diagnosis not present

## 2019-03-15 DIAGNOSIS — R2689 Other abnormalities of gait and mobility: Secondary | ICD-10-CM | POA: Diagnosis not present

## 2019-03-15 DIAGNOSIS — I4891 Unspecified atrial fibrillation: Secondary | ICD-10-CM | POA: Diagnosis not present

## 2019-03-16 DIAGNOSIS — Z7409 Other reduced mobility: Secondary | ICD-10-CM | POA: Diagnosis not present

## 2019-03-16 DIAGNOSIS — I480 Paroxysmal atrial fibrillation: Secondary | ICD-10-CM | POA: Diagnosis not present

## 2019-03-16 DIAGNOSIS — I255 Ischemic cardiomyopathy: Secondary | ICD-10-CM | POA: Diagnosis not present

## 2019-03-16 DIAGNOSIS — K219 Gastro-esophageal reflux disease without esophagitis: Secondary | ICD-10-CM | POA: Diagnosis not present

## 2019-03-16 DIAGNOSIS — I10 Essential (primary) hypertension: Secondary | ICD-10-CM | POA: Diagnosis not present

## 2019-03-16 DIAGNOSIS — I2510 Atherosclerotic heart disease of native coronary artery without angina pectoris: Secondary | ICD-10-CM | POA: Diagnosis not present

## 2019-03-16 DIAGNOSIS — I513 Intracardiac thrombosis, not elsewhere classified: Secondary | ICD-10-CM | POA: Diagnosis not present

## 2019-03-16 DIAGNOSIS — I5023 Acute on chronic systolic (congestive) heart failure: Secondary | ICD-10-CM | POA: Diagnosis not present

## 2019-03-16 DIAGNOSIS — R2689 Other abnormalities of gait and mobility: Secondary | ICD-10-CM | POA: Diagnosis not present

## 2019-03-23 DIAGNOSIS — I5022 Chronic systolic (congestive) heart failure: Secondary | ICD-10-CM | POA: Diagnosis not present

## 2019-03-23 DIAGNOSIS — Z7902 Long term (current) use of antithrombotics/antiplatelets: Secondary | ICD-10-CM | POA: Diagnosis not present

## 2019-03-23 DIAGNOSIS — K219 Gastro-esophageal reflux disease without esophagitis: Secondary | ICD-10-CM | POA: Diagnosis not present

## 2019-03-23 DIAGNOSIS — I480 Paroxysmal atrial fibrillation: Secondary | ICD-10-CM | POA: Diagnosis not present

## 2019-03-23 DIAGNOSIS — I2510 Atherosclerotic heart disease of native coronary artery without angina pectoris: Secondary | ICD-10-CM | POA: Diagnosis not present

## 2019-03-23 DIAGNOSIS — I110 Hypertensive heart disease with heart failure: Secondary | ICD-10-CM | POA: Diagnosis not present

## 2019-03-23 DIAGNOSIS — I272 Pulmonary hypertension, unspecified: Secondary | ICD-10-CM | POA: Diagnosis not present

## 2019-03-23 DIAGNOSIS — G5602 Carpal tunnel syndrome, left upper limb: Secondary | ICD-10-CM | POA: Diagnosis not present

## 2019-03-23 DIAGNOSIS — E785 Hyperlipidemia, unspecified: Secondary | ICD-10-CM | POA: Diagnosis not present

## 2019-03-27 DIAGNOSIS — I5022 Chronic systolic (congestive) heart failure: Secondary | ICD-10-CM | POA: Diagnosis not present

## 2019-03-27 DIAGNOSIS — K219 Gastro-esophageal reflux disease without esophagitis: Secondary | ICD-10-CM | POA: Diagnosis not present

## 2019-03-27 DIAGNOSIS — I2510 Atherosclerotic heart disease of native coronary artery without angina pectoris: Secondary | ICD-10-CM | POA: Diagnosis not present

## 2019-03-27 DIAGNOSIS — I480 Paroxysmal atrial fibrillation: Secondary | ICD-10-CM | POA: Diagnosis not present

## 2019-03-27 DIAGNOSIS — E785 Hyperlipidemia, unspecified: Secondary | ICD-10-CM | POA: Diagnosis not present

## 2019-03-27 DIAGNOSIS — G5602 Carpal tunnel syndrome, left upper limb: Secondary | ICD-10-CM | POA: Diagnosis not present

## 2019-03-27 DIAGNOSIS — Z7902 Long term (current) use of antithrombotics/antiplatelets: Secondary | ICD-10-CM | POA: Diagnosis not present

## 2019-03-27 DIAGNOSIS — I110 Hypertensive heart disease with heart failure: Secondary | ICD-10-CM | POA: Diagnosis not present

## 2019-03-27 DIAGNOSIS — I272 Pulmonary hypertension, unspecified: Secondary | ICD-10-CM | POA: Diagnosis not present

## 2019-03-28 DIAGNOSIS — I272 Pulmonary hypertension, unspecified: Secondary | ICD-10-CM | POA: Diagnosis not present

## 2019-03-28 DIAGNOSIS — I5022 Chronic systolic (congestive) heart failure: Secondary | ICD-10-CM | POA: Diagnosis not present

## 2019-03-28 DIAGNOSIS — I480 Paroxysmal atrial fibrillation: Secondary | ICD-10-CM | POA: Diagnosis not present

## 2019-03-28 DIAGNOSIS — I110 Hypertensive heart disease with heart failure: Secondary | ICD-10-CM | POA: Diagnosis not present

## 2019-03-28 DIAGNOSIS — E785 Hyperlipidemia, unspecified: Secondary | ICD-10-CM | POA: Diagnosis not present

## 2019-03-28 DIAGNOSIS — G5602 Carpal tunnel syndrome, left upper limb: Secondary | ICD-10-CM | POA: Diagnosis not present

## 2019-03-28 DIAGNOSIS — K219 Gastro-esophageal reflux disease without esophagitis: Secondary | ICD-10-CM | POA: Diagnosis not present

## 2019-03-28 DIAGNOSIS — Z7902 Long term (current) use of antithrombotics/antiplatelets: Secondary | ICD-10-CM | POA: Diagnosis not present

## 2019-03-28 DIAGNOSIS — I2510 Atherosclerotic heart disease of native coronary artery without angina pectoris: Secondary | ICD-10-CM | POA: Diagnosis not present

## 2019-03-29 DIAGNOSIS — I480 Paroxysmal atrial fibrillation: Secondary | ICD-10-CM | POA: Diagnosis not present

## 2019-03-29 DIAGNOSIS — G5602 Carpal tunnel syndrome, left upper limb: Secondary | ICD-10-CM | POA: Diagnosis not present

## 2019-03-29 DIAGNOSIS — I2510 Atherosclerotic heart disease of native coronary artery without angina pectoris: Secondary | ICD-10-CM | POA: Diagnosis not present

## 2019-03-29 DIAGNOSIS — I110 Hypertensive heart disease with heart failure: Secondary | ICD-10-CM | POA: Diagnosis not present

## 2019-03-29 DIAGNOSIS — Z7902 Long term (current) use of antithrombotics/antiplatelets: Secondary | ICD-10-CM | POA: Diagnosis not present

## 2019-03-29 DIAGNOSIS — K219 Gastro-esophageal reflux disease without esophagitis: Secondary | ICD-10-CM | POA: Diagnosis not present

## 2019-03-29 DIAGNOSIS — E785 Hyperlipidemia, unspecified: Secondary | ICD-10-CM | POA: Diagnosis not present

## 2019-03-29 DIAGNOSIS — I5022 Chronic systolic (congestive) heart failure: Secondary | ICD-10-CM | POA: Diagnosis not present

## 2019-03-29 DIAGNOSIS — I272 Pulmonary hypertension, unspecified: Secondary | ICD-10-CM | POA: Diagnosis not present

## 2019-03-31 DIAGNOSIS — I5022 Chronic systolic (congestive) heart failure: Secondary | ICD-10-CM | POA: Diagnosis not present

## 2019-03-31 DIAGNOSIS — I480 Paroxysmal atrial fibrillation: Secondary | ICD-10-CM | POA: Diagnosis not present

## 2019-03-31 DIAGNOSIS — I2510 Atherosclerotic heart disease of native coronary artery without angina pectoris: Secondary | ICD-10-CM | POA: Diagnosis not present

## 2019-03-31 DIAGNOSIS — E785 Hyperlipidemia, unspecified: Secondary | ICD-10-CM | POA: Diagnosis not present

## 2019-03-31 DIAGNOSIS — K219 Gastro-esophageal reflux disease without esophagitis: Secondary | ICD-10-CM | POA: Diagnosis not present

## 2019-03-31 DIAGNOSIS — I272 Pulmonary hypertension, unspecified: Secondary | ICD-10-CM | POA: Diagnosis not present

## 2019-03-31 DIAGNOSIS — Z7902 Long term (current) use of antithrombotics/antiplatelets: Secondary | ICD-10-CM | POA: Diagnosis not present

## 2019-03-31 DIAGNOSIS — I110 Hypertensive heart disease with heart failure: Secondary | ICD-10-CM | POA: Diagnosis not present

## 2019-03-31 DIAGNOSIS — G5602 Carpal tunnel syndrome, left upper limb: Secondary | ICD-10-CM | POA: Diagnosis not present

## 2019-04-03 DIAGNOSIS — I5020 Unspecified systolic (congestive) heart failure: Secondary | ICD-10-CM | POA: Diagnosis not present

## 2019-04-03 DIAGNOSIS — I429 Cardiomyopathy, unspecified: Secondary | ICD-10-CM | POA: Diagnosis not present

## 2019-04-04 DIAGNOSIS — I110 Hypertensive heart disease with heart failure: Secondary | ICD-10-CM | POA: Diagnosis not present

## 2019-04-04 DIAGNOSIS — K219 Gastro-esophageal reflux disease without esophagitis: Secondary | ICD-10-CM | POA: Diagnosis not present

## 2019-04-04 DIAGNOSIS — I2510 Atherosclerotic heart disease of native coronary artery without angina pectoris: Secondary | ICD-10-CM | POA: Diagnosis not present

## 2019-04-04 DIAGNOSIS — E785 Hyperlipidemia, unspecified: Secondary | ICD-10-CM | POA: Diagnosis not present

## 2019-04-04 DIAGNOSIS — Z7902 Long term (current) use of antithrombotics/antiplatelets: Secondary | ICD-10-CM | POA: Diagnosis not present

## 2019-04-04 DIAGNOSIS — I480 Paroxysmal atrial fibrillation: Secondary | ICD-10-CM | POA: Diagnosis not present

## 2019-04-04 DIAGNOSIS — I272 Pulmonary hypertension, unspecified: Secondary | ICD-10-CM | POA: Diagnosis not present

## 2019-04-04 DIAGNOSIS — G5602 Carpal tunnel syndrome, left upper limb: Secondary | ICD-10-CM | POA: Diagnosis not present

## 2019-04-04 DIAGNOSIS — I5022 Chronic systolic (congestive) heart failure: Secondary | ICD-10-CM | POA: Diagnosis not present

## 2019-04-06 DIAGNOSIS — I5022 Chronic systolic (congestive) heart failure: Secondary | ICD-10-CM | POA: Diagnosis not present

## 2019-04-06 DIAGNOSIS — I272 Pulmonary hypertension, unspecified: Secondary | ICD-10-CM | POA: Diagnosis not present

## 2019-04-06 DIAGNOSIS — I2510 Atherosclerotic heart disease of native coronary artery without angina pectoris: Secondary | ICD-10-CM | POA: Diagnosis not present

## 2019-04-06 DIAGNOSIS — Z7902 Long term (current) use of antithrombotics/antiplatelets: Secondary | ICD-10-CM | POA: Diagnosis not present

## 2019-04-06 DIAGNOSIS — I110 Hypertensive heart disease with heart failure: Secondary | ICD-10-CM | POA: Diagnosis not present

## 2019-04-06 DIAGNOSIS — E785 Hyperlipidemia, unspecified: Secondary | ICD-10-CM | POA: Diagnosis not present

## 2019-04-06 DIAGNOSIS — K219 Gastro-esophageal reflux disease without esophagitis: Secondary | ICD-10-CM | POA: Diagnosis not present

## 2019-04-06 DIAGNOSIS — I480 Paroxysmal atrial fibrillation: Secondary | ICD-10-CM | POA: Diagnosis not present

## 2019-04-06 DIAGNOSIS — G5602 Carpal tunnel syndrome, left upper limb: Secondary | ICD-10-CM | POA: Diagnosis not present

## 2019-04-10 DIAGNOSIS — I5022 Chronic systolic (congestive) heart failure: Secondary | ICD-10-CM | POA: Diagnosis not present

## 2019-04-10 DIAGNOSIS — E785 Hyperlipidemia, unspecified: Secondary | ICD-10-CM | POA: Diagnosis not present

## 2019-04-10 DIAGNOSIS — I480 Paroxysmal atrial fibrillation: Secondary | ICD-10-CM | POA: Diagnosis not present

## 2019-04-10 DIAGNOSIS — I272 Pulmonary hypertension, unspecified: Secondary | ICD-10-CM | POA: Diagnosis not present

## 2019-04-10 DIAGNOSIS — Z7902 Long term (current) use of antithrombotics/antiplatelets: Secondary | ICD-10-CM | POA: Diagnosis not present

## 2019-04-10 DIAGNOSIS — I2510 Atherosclerotic heart disease of native coronary artery without angina pectoris: Secondary | ICD-10-CM | POA: Diagnosis not present

## 2019-04-10 DIAGNOSIS — I110 Hypertensive heart disease with heart failure: Secondary | ICD-10-CM | POA: Diagnosis not present

## 2019-04-10 DIAGNOSIS — K219 Gastro-esophageal reflux disease without esophagitis: Secondary | ICD-10-CM | POA: Diagnosis not present

## 2019-04-10 DIAGNOSIS — G5602 Carpal tunnel syndrome, left upper limb: Secondary | ICD-10-CM | POA: Diagnosis not present

## 2019-04-11 DIAGNOSIS — I480 Paroxysmal atrial fibrillation: Secondary | ICD-10-CM | POA: Diagnosis not present

## 2019-04-11 DIAGNOSIS — G5602 Carpal tunnel syndrome, left upper limb: Secondary | ICD-10-CM | POA: Diagnosis not present

## 2019-04-11 DIAGNOSIS — I2510 Atherosclerotic heart disease of native coronary artery without angina pectoris: Secondary | ICD-10-CM | POA: Diagnosis not present

## 2019-04-11 DIAGNOSIS — I272 Pulmonary hypertension, unspecified: Secondary | ICD-10-CM | POA: Diagnosis not present

## 2019-04-11 DIAGNOSIS — I5022 Chronic systolic (congestive) heart failure: Secondary | ICD-10-CM | POA: Diagnosis not present

## 2019-04-11 DIAGNOSIS — I110 Hypertensive heart disease with heart failure: Secondary | ICD-10-CM | POA: Diagnosis not present

## 2019-04-11 DIAGNOSIS — Z7902 Long term (current) use of antithrombotics/antiplatelets: Secondary | ICD-10-CM | POA: Diagnosis not present

## 2019-04-11 DIAGNOSIS — E785 Hyperlipidemia, unspecified: Secondary | ICD-10-CM | POA: Diagnosis not present

## 2019-04-11 DIAGNOSIS — K219 Gastro-esophageal reflux disease without esophagitis: Secondary | ICD-10-CM | POA: Diagnosis not present

## 2019-04-13 DIAGNOSIS — E785 Hyperlipidemia, unspecified: Secondary | ICD-10-CM | POA: Diagnosis not present

## 2019-04-13 DIAGNOSIS — K219 Gastro-esophageal reflux disease without esophagitis: Secondary | ICD-10-CM | POA: Diagnosis not present

## 2019-04-13 DIAGNOSIS — I5022 Chronic systolic (congestive) heart failure: Secondary | ICD-10-CM | POA: Diagnosis not present

## 2019-04-13 DIAGNOSIS — I2510 Atherosclerotic heart disease of native coronary artery without angina pectoris: Secondary | ICD-10-CM | POA: Diagnosis not present

## 2019-04-13 DIAGNOSIS — I110 Hypertensive heart disease with heart failure: Secondary | ICD-10-CM | POA: Diagnosis not present

## 2019-04-13 DIAGNOSIS — G5602 Carpal tunnel syndrome, left upper limb: Secondary | ICD-10-CM | POA: Diagnosis not present

## 2019-04-13 DIAGNOSIS — I272 Pulmonary hypertension, unspecified: Secondary | ICD-10-CM | POA: Diagnosis not present

## 2019-04-13 DIAGNOSIS — Z7902 Long term (current) use of antithrombotics/antiplatelets: Secondary | ICD-10-CM | POA: Diagnosis not present

## 2019-04-13 DIAGNOSIS — I480 Paroxysmal atrial fibrillation: Secondary | ICD-10-CM | POA: Diagnosis not present

## 2019-04-14 DIAGNOSIS — I480 Paroxysmal atrial fibrillation: Secondary | ICD-10-CM | POA: Diagnosis not present

## 2019-04-14 DIAGNOSIS — G5602 Carpal tunnel syndrome, left upper limb: Secondary | ICD-10-CM | POA: Diagnosis not present

## 2019-04-14 DIAGNOSIS — K219 Gastro-esophageal reflux disease without esophagitis: Secondary | ICD-10-CM | POA: Diagnosis not present

## 2019-04-14 DIAGNOSIS — Z7902 Long term (current) use of antithrombotics/antiplatelets: Secondary | ICD-10-CM | POA: Diagnosis not present

## 2019-04-14 DIAGNOSIS — E785 Hyperlipidemia, unspecified: Secondary | ICD-10-CM | POA: Diagnosis not present

## 2019-04-14 DIAGNOSIS — I5022 Chronic systolic (congestive) heart failure: Secondary | ICD-10-CM | POA: Diagnosis not present

## 2019-04-14 DIAGNOSIS — I110 Hypertensive heart disease with heart failure: Secondary | ICD-10-CM | POA: Diagnosis not present

## 2019-04-14 DIAGNOSIS — I2510 Atherosclerotic heart disease of native coronary artery without angina pectoris: Secondary | ICD-10-CM | POA: Diagnosis not present

## 2019-04-14 DIAGNOSIS — I272 Pulmonary hypertension, unspecified: Secondary | ICD-10-CM | POA: Diagnosis not present

## 2019-04-17 DIAGNOSIS — I480 Paroxysmal atrial fibrillation: Secondary | ICD-10-CM | POA: Diagnosis not present

## 2019-04-17 DIAGNOSIS — I2510 Atherosclerotic heart disease of native coronary artery without angina pectoris: Secondary | ICD-10-CM | POA: Diagnosis not present

## 2019-04-17 DIAGNOSIS — E785 Hyperlipidemia, unspecified: Secondary | ICD-10-CM | POA: Diagnosis not present

## 2019-04-17 DIAGNOSIS — I272 Pulmonary hypertension, unspecified: Secondary | ICD-10-CM | POA: Diagnosis not present

## 2019-04-17 DIAGNOSIS — I110 Hypertensive heart disease with heart failure: Secondary | ICD-10-CM | POA: Diagnosis not present

## 2019-04-17 DIAGNOSIS — G5602 Carpal tunnel syndrome, left upper limb: Secondary | ICD-10-CM | POA: Diagnosis not present

## 2019-04-17 DIAGNOSIS — I5022 Chronic systolic (congestive) heart failure: Secondary | ICD-10-CM | POA: Diagnosis not present

## 2019-04-17 DIAGNOSIS — K219 Gastro-esophageal reflux disease without esophagitis: Secondary | ICD-10-CM | POA: Diagnosis not present

## 2019-04-17 DIAGNOSIS — Z7902 Long term (current) use of antithrombotics/antiplatelets: Secondary | ICD-10-CM | POA: Diagnosis not present

## 2019-04-20 DIAGNOSIS — I480 Paroxysmal atrial fibrillation: Secondary | ICD-10-CM | POA: Diagnosis not present

## 2019-04-20 DIAGNOSIS — I110 Hypertensive heart disease with heart failure: Secondary | ICD-10-CM | POA: Diagnosis not present

## 2019-04-20 DIAGNOSIS — K219 Gastro-esophageal reflux disease without esophagitis: Secondary | ICD-10-CM | POA: Diagnosis not present

## 2019-04-20 DIAGNOSIS — E785 Hyperlipidemia, unspecified: Secondary | ICD-10-CM | POA: Diagnosis not present

## 2019-04-20 DIAGNOSIS — I2510 Atherosclerotic heart disease of native coronary artery without angina pectoris: Secondary | ICD-10-CM | POA: Diagnosis not present

## 2019-04-20 DIAGNOSIS — I5022 Chronic systolic (congestive) heart failure: Secondary | ICD-10-CM | POA: Diagnosis not present

## 2019-04-20 DIAGNOSIS — I272 Pulmonary hypertension, unspecified: Secondary | ICD-10-CM | POA: Diagnosis not present

## 2019-04-20 DIAGNOSIS — G5602 Carpal tunnel syndrome, left upper limb: Secondary | ICD-10-CM | POA: Diagnosis not present

## 2019-04-20 DIAGNOSIS — Z7902 Long term (current) use of antithrombotics/antiplatelets: Secondary | ICD-10-CM | POA: Diagnosis not present

## 2019-04-21 DIAGNOSIS — J189 Pneumonia, unspecified organism: Secondary | ICD-10-CM | POA: Diagnosis not present

## 2019-04-21 DIAGNOSIS — N390 Urinary tract infection, site not specified: Secondary | ICD-10-CM | POA: Diagnosis not present

## 2019-04-21 DIAGNOSIS — Z515 Encounter for palliative care: Secondary | ICD-10-CM | POA: Diagnosis not present

## 2019-04-21 DIAGNOSIS — Z20822 Contact with and (suspected) exposure to covid-19: Secondary | ICD-10-CM | POA: Diagnosis not present

## 2019-04-21 DIAGNOSIS — J9601 Acute respiratory failure with hypoxia: Secondary | ICD-10-CM | POA: Diagnosis not present

## 2019-04-21 DIAGNOSIS — I110 Hypertensive heart disease with heart failure: Secondary | ICD-10-CM | POA: Diagnosis not present

## 2019-04-21 DIAGNOSIS — Z79899 Other long term (current) drug therapy: Secondary | ICD-10-CM | POA: Diagnosis not present

## 2019-04-21 DIAGNOSIS — R0789 Other chest pain: Secondary | ICD-10-CM | POA: Diagnosis not present

## 2019-04-21 DIAGNOSIS — J159 Unspecified bacterial pneumonia: Secondary | ICD-10-CM | POA: Diagnosis not present

## 2019-04-21 DIAGNOSIS — B9620 Unspecified Escherichia coli [E. coli] as the cause of diseases classified elsewhere: Secondary | ICD-10-CM | POA: Diagnosis not present

## 2019-04-21 DIAGNOSIS — R6520 Severe sepsis without septic shock: Secondary | ICD-10-CM | POA: Diagnosis not present

## 2019-04-21 DIAGNOSIS — R0602 Shortness of breath: Secondary | ICD-10-CM | POA: Diagnosis not present

## 2019-04-21 DIAGNOSIS — R918 Other nonspecific abnormal finding of lung field: Secondary | ICD-10-CM | POA: Diagnosis not present

## 2019-04-21 DIAGNOSIS — Z7901 Long term (current) use of anticoagulants: Secondary | ICD-10-CM | POA: Diagnosis not present

## 2019-04-21 DIAGNOSIS — I5023 Acute on chronic systolic (congestive) heart failure: Secondary | ICD-10-CM | POA: Diagnosis not present

## 2019-04-21 DIAGNOSIS — A4151 Sepsis due to Escherichia coli [E. coli]: Secondary | ICD-10-CM | POA: Diagnosis not present

## 2019-04-21 DIAGNOSIS — A419 Sepsis, unspecified organism: Secondary | ICD-10-CM | POA: Diagnosis not present

## 2019-04-21 DIAGNOSIS — I447 Left bundle-branch block, unspecified: Secondary | ICD-10-CM | POA: Diagnosis not present

## 2019-04-21 DIAGNOSIS — M2550 Pain in unspecified joint: Secondary | ICD-10-CM | POA: Diagnosis not present

## 2019-04-21 DIAGNOSIS — D72829 Elevated white blood cell count, unspecified: Secondary | ICD-10-CM | POA: Diagnosis not present

## 2019-04-21 DIAGNOSIS — Z95810 Presence of automatic (implantable) cardiac defibrillator: Secondary | ICD-10-CM | POA: Diagnosis not present

## 2019-04-21 DIAGNOSIS — N183 Chronic kidney disease, stage 3 unspecified: Secondary | ICD-10-CM | POA: Diagnosis not present

## 2019-04-21 DIAGNOSIS — I50810 Right heart failure, unspecified: Secondary | ICD-10-CM | POA: Diagnosis not present

## 2019-04-21 DIAGNOSIS — Z209 Contact with and (suspected) exposure to unspecified communicable disease: Secondary | ICD-10-CM | POA: Diagnosis not present

## 2019-04-21 DIAGNOSIS — E872 Acidosis: Secondary | ICD-10-CM | POA: Diagnosis not present

## 2019-04-21 DIAGNOSIS — N179 Acute kidney failure, unspecified: Secondary | ICD-10-CM | POA: Diagnosis not present

## 2019-04-21 DIAGNOSIS — I501 Left ventricular failure: Secondary | ICD-10-CM | POA: Diagnosis not present

## 2019-04-21 DIAGNOSIS — Z87891 Personal history of nicotine dependence: Secondary | ICD-10-CM | POA: Diagnosis not present

## 2019-04-21 DIAGNOSIS — I480 Paroxysmal atrial fibrillation: Secondary | ICD-10-CM | POA: Diagnosis not present

## 2019-04-21 DIAGNOSIS — R54 Age-related physical debility: Secondary | ICD-10-CM | POA: Diagnosis not present

## 2019-04-21 DIAGNOSIS — I509 Heart failure, unspecified: Secondary | ICD-10-CM | POA: Diagnosis not present

## 2019-04-21 DIAGNOSIS — Z7401 Bed confinement status: Secondary | ICD-10-CM | POA: Diagnosis not present

## 2019-04-21 DIAGNOSIS — I714 Abdominal aortic aneurysm, without rupture: Secondary | ICD-10-CM | POA: Diagnosis not present

## 2019-04-21 DIAGNOSIS — I499 Cardiac arrhythmia, unspecified: Secondary | ICD-10-CM | POA: Diagnosis not present

## 2019-04-21 DIAGNOSIS — Z66 Do not resuscitate: Secondary | ICD-10-CM | POA: Diagnosis not present

## 2019-04-21 DIAGNOSIS — I21A1 Myocardial infarction type 2: Secondary | ICD-10-CM | POA: Diagnosis not present

## 2019-04-21 DIAGNOSIS — I255 Ischemic cardiomyopathy: Secondary | ICD-10-CM | POA: Diagnosis not present

## 2019-04-21 DIAGNOSIS — R079 Chest pain, unspecified: Secondary | ICD-10-CM | POA: Diagnosis not present

## 2019-04-21 DIAGNOSIS — R52 Pain, unspecified: Secondary | ICD-10-CM | POA: Diagnosis not present

## 2019-04-21 DIAGNOSIS — I959 Hypotension, unspecified: Secondary | ICD-10-CM | POA: Diagnosis not present

## 2019-04-21 DIAGNOSIS — R0902 Hypoxemia: Secondary | ICD-10-CM | POA: Diagnosis not present

## 2019-04-21 DIAGNOSIS — R509 Fever, unspecified: Secondary | ICD-10-CM | POA: Diagnosis not present

## 2019-04-21 DIAGNOSIS — I491 Atrial premature depolarization: Secondary | ICD-10-CM | POA: Diagnosis not present

## 2019-04-21 DIAGNOSIS — N189 Chronic kidney disease, unspecified: Secondary | ICD-10-CM | POA: Diagnosis not present

## 2019-04-21 DIAGNOSIS — D689 Coagulation defect, unspecified: Secondary | ICD-10-CM | POA: Diagnosis not present

## 2019-04-21 DIAGNOSIS — I4891 Unspecified atrial fibrillation: Secondary | ICD-10-CM | POA: Diagnosis not present

## 2019-04-21 DIAGNOSIS — I454 Nonspecific intraventricular block: Secondary | ICD-10-CM | POA: Diagnosis not present

## 2019-04-21 DIAGNOSIS — R6521 Severe sepsis with septic shock: Secondary | ICD-10-CM | POA: Diagnosis not present

## 2019-04-21 DIAGNOSIS — I4819 Other persistent atrial fibrillation: Secondary | ICD-10-CM | POA: Diagnosis not present

## 2019-04-21 DIAGNOSIS — I472 Ventricular tachycardia: Secondary | ICD-10-CM | POA: Diagnosis not present

## 2019-05-09 ENCOUNTER — Encounter (HOSPITAL_COMMUNITY): Payer: Medicare HMO

## 2019-05-09 ENCOUNTER — Encounter: Payer: Medicare HMO | Admitting: Vascular Surgery

## 2019-05-09 ENCOUNTER — Other Ambulatory Visit (HOSPITAL_COMMUNITY): Payer: Medicare HMO

## 2019-05-15 DEATH — deceased

## 2019-08-31 ENCOUNTER — Encounter: Payer: Self-pay | Admitting: Surgery
